# Patient Record
Sex: Male | Born: 1985 | Race: White | Hispanic: No | Marital: Married | Smoking: Former smoker
Health system: Southern US, Community
[De-identification: ages and names within clinical notes are randomized; demographics above are authoritative.]

## PROBLEM LIST (undated history)

## (undated) DIAGNOSIS — K219 Gastro-esophageal reflux disease without esophagitis: Secondary | ICD-10-CM

## (undated) DIAGNOSIS — X58XXXA Exposure to other specified factors, initial encounter: Secondary | ICD-10-CM

## (undated) HISTORY — DX: Exposure to other specified factors, initial encounter: X58.XXXA

---

## 2006-10-01 HISTORY — PX: HERNIA REPAIR: SHX51

## 2006-12-10 ENCOUNTER — Ambulatory Visit: Payer: Self-pay | Admitting: Surgery

## 2006-12-18 ENCOUNTER — Ambulatory Visit: Payer: Self-pay | Admitting: Surgery

## 2007-10-02 HISTORY — PX: WISDOM TOOTH EXTRACTION: SHX21

## 2008-10-01 DIAGNOSIS — X58XXXA Exposure to other specified factors, initial encounter: Secondary | ICD-10-CM

## 2008-10-01 HISTORY — DX: Exposure to other specified factors, initial encounter: X58.XXXA

## 2009-10-01 HISTORY — PX: LEG SURGERY: SHX1003

## 2015-12-21 ENCOUNTER — Ambulatory Visit: Payer: Self-pay | Admitting: Family Medicine

## 2015-12-21 ENCOUNTER — Encounter: Payer: Self-pay | Admitting: Family Medicine

## 2016-10-05 ENCOUNTER — Encounter: Payer: Self-pay | Admitting: Family Medicine

## 2016-10-05 ENCOUNTER — Ambulatory Visit (INDEPENDENT_AMBULATORY_CARE_PROVIDER_SITE_OTHER): Payer: BLUE CROSS/BLUE SHIELD | Admitting: Family Medicine

## 2016-10-05 VITALS — BP 124/70 | HR 96 | Temp 98.0°F | Resp 16 | Ht 70.0 in | Wt 283.0 lb

## 2016-10-05 DIAGNOSIS — Z6841 Body Mass Index (BMI) 40.0 and over, adult: Secondary | ICD-10-CM

## 2016-10-05 DIAGNOSIS — Z131 Encounter for screening for diabetes mellitus: Secondary | ICD-10-CM

## 2016-10-05 DIAGNOSIS — Z0000 Encounter for general adult medical examination without abnormal findings: Secondary | ICD-10-CM | POA: Diagnosis not present

## 2016-10-05 DIAGNOSIS — Z720 Tobacco use: Secondary | ICD-10-CM

## 2016-10-05 DIAGNOSIS — Z6833 Body mass index (BMI) 33.0-33.9, adult: Secondary | ICD-10-CM | POA: Insufficient documentation

## 2016-10-05 DIAGNOSIS — G8929 Other chronic pain: Secondary | ICD-10-CM

## 2016-10-05 DIAGNOSIS — E782 Mixed hyperlipidemia: Secondary | ICD-10-CM

## 2016-10-05 DIAGNOSIS — M79604 Pain in right leg: Secondary | ICD-10-CM | POA: Diagnosis not present

## 2016-10-05 DIAGNOSIS — L0591 Pilonidal cyst without abscess: Secondary | ICD-10-CM | POA: Insufficient documentation

## 2016-10-05 DIAGNOSIS — E785 Hyperlipidemia, unspecified: Secondary | ICD-10-CM | POA: Insufficient documentation

## 2016-10-05 LAB — LIPID PANEL
CHOLESTEROL: 207 mg/dL — AB (ref <200)
HDL: 30 mg/dL — ABNORMAL LOW (ref >40)
LDL CALC: 141 mg/dL — AB (ref <100)
TRIGLYCERIDES: 179 mg/dL — AB (ref <150)
Total CHOL/HDL Ratio: 6.9 Ratio — ABNORMAL HIGH (ref <5.0)
VLDL: 36 mg/dL — AB (ref <30)

## 2016-10-05 LAB — COMPLETE METABOLIC PANEL WITH GFR
ALBUMIN: 4.4 g/dL (ref 3.6–5.1)
ALK PHOS: 41 U/L (ref 40–115)
ALT: 27 U/L (ref 9–46)
AST: 19 U/L (ref 10–40)
BUN: 12 mg/dL (ref 7–25)
CO2: 25 mmol/L (ref 20–31)
Calcium: 9.6 mg/dL (ref 8.6–10.3)
Chloride: 106 mmol/L (ref 98–110)
Creat: 0.73 mg/dL (ref 0.60–1.35)
GFR, Est African American: >89 mL/min (ref >=60)
GLUCOSE: 83 mg/dL (ref 65–99)
POTASSIUM: 4.4 mmol/L (ref 3.5–5.3)
SODIUM: 139 mmol/L (ref 135–146)
Total Bilirubin: 0.5 mg/dL (ref 0.2–1.2)
Total Protein: 7 g/dL (ref 6.1–8.1)

## 2016-10-05 LAB — HEMOGLOBIN A1C
Hgb A1c MFr Bld: 5.1 % (ref <5.7)
MEAN PLASMA GLUCOSE: 100 mg/dL

## 2016-10-05 NOTE — Patient Instructions (Signed)
Thank you for coming in to clinic today.  1.   Referral ordered, they will contact you with appointment. Let us know sooner if worsening pain swelling redness, fevers/chills, we can start you on an antibiotic  [facility] [address] [place]  [id number] Phone: [phone]  Donnalee CurryJeffrey Byrnett, MD  Lab results early next week through MyChart, let me know if questions.  Keep up the good work with future plans to start back at gym, exercise, and healthy diet. Try to limit carbs/portion size, reduce soda, and increase water.  Try to quit smoking, and let me know if need help with this.  Please schedule a follow-up appointment with Dr. Althea CharonKaramalegos in 12 months for Annual Physical, otherwise can return in 6 months for BP and Weight Check  If you have any other questions or concerns, please feel free to call the clinic or send a message through MyChart. You may also schedule an earlier appointment if necessary.  Saralyn PilarAlexander Karamalegos, DO [facility]
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
## 2016-10-05 NOTE — Assessment & Plan Note (Signed)
History of HLD, without recent lab values >1 yr, no prior medication or statin therapy. - Check fasting lipids today (did have creamer in coffee)
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
## 2016-10-05 NOTE — Assessment & Plan Note (Signed)
Stable chronic problem, appears most consistent with superior gluteal cleft pilonidal cyst with some chronic induration, intermittent bloody drainage without recent purulence. No extending erythema or other obvious complication.  Plan: 1. [facility] for further evaluation and anticipate surgical removal. Hold antibiotics at this time given no evidence of acute infection. Return criteria given

## 2016-10-05 NOTE — Assessment & Plan Note (Signed)
Persistent weight gain over 1-2 years, +30-40 lbs, inc stress and no longer regular exercise/gym - Encouraged lifestyle changes with diet/exercise, screening A1c, lipids, chemistry

## 2016-10-05 NOTE — Assessment & Plan Note (Addendum)
Active smoker, now intermittent only, trying to quit on own. Encouraged smoking cessation, reviewed treatment options, plan for continued taper method, also wife trying to quit will help  Discussion today >5 minutes (<10 minutes) specifically on counseling on risks of tobacco use, complications, treatment, smoking cessation.

## 2016-10-10 ENCOUNTER — Encounter: Payer: Self-pay | Admitting: *Deleted

## 2016-10-17 ENCOUNTER — Ambulatory Visit: Payer: Self-pay | Admitting: General Surgery

## 2016-10-25 ENCOUNTER — Ambulatory Visit (INDEPENDENT_AMBULATORY_CARE_PROVIDER_SITE_OTHER): Payer: BLUE CROSS/BLUE SHIELD | Admitting: General Surgery

## 2016-10-25 ENCOUNTER — Encounter: Payer: Self-pay | Admitting: General Surgery

## 2016-10-25 VITALS — BP 130/78 | HR 82 | Resp 12 | Ht 71.0 in | Wt 281.0 lb

## 2016-10-25 DIAGNOSIS — L0501 Pilonidal cyst with abscess: Secondary | ICD-10-CM

## 2016-10-25 MED ORDER — DOXYCYCLINE HYCLATE 100 MG PO CAPS: 100.0000 mg | ORAL_CAPSULE | Freq: Two times a day (BID) | ORAL | 0 refills | Status: DC

## 2016-10-25 NOTE — Patient Instructions (Signed)
Follow-up in 2 weeks. Begin Doxycycline 100 mg twice a day x 2 weeks.
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
## 2016-10-30 LAB — ANAEROBIC AND AEROBIC CULTURE

## 2016-11-15 ENCOUNTER — Encounter: Payer: Self-pay | Admitting: *Deleted

## 2016-11-15 ENCOUNTER — Ambulatory Visit (INDEPENDENT_AMBULATORY_CARE_PROVIDER_SITE_OTHER): Payer: BLUE CROSS/BLUE SHIELD | Admitting: General Surgery

## 2016-11-15 ENCOUNTER — Encounter: Payer: Self-pay | Admitting: General Surgery

## 2016-11-15 VITALS — BP 144/82 | HR 100 | Resp 16 | Ht 71.0 in | Wt 281.0 lb

## 2016-11-15 DIAGNOSIS — L0501 Pilonidal cyst with abscess: Secondary | ICD-10-CM | POA: Diagnosis not present
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
## 2016-11-15 NOTE — Patient Instructions (Addendum)
The patient is aware to call back for any questions or concerns. Pilonidal Cyst Introduction A pilonidal cyst is a fluid-filled sac. It forms beneath the skin near your tailbone, at the top of the crease of your buttocks. A pilonidal cyst that is not large or infected may not cause symptoms or problems. If the cyst becomes irritated or infected, it may fill with pus. This causes pain and swelling (pilonidal abscess). An infected cyst may need to be treated with medicine, drained, or removed. What are the causes? The cause of a pilonidal cyst is not known. One cause may be a hair that grows into your skin (ingrown hair). What increases the risk? Pilonidal cysts are more common in boys and men. Risk factors include:  Having lots of hair near the crease of the buttocks.  Being overweight.  Having a pilonidal dimple.  Wearing tight clothing.  Not bathing or showering frequently.  Sitting for long periods of time. What are the signs or symptoms? Signs and symptoms of a pilonidal cyst may include:  Redness.  Pain and tenderness.  Warmth.  Swelling.  Pus.  Fever. How is this diagnosed? Your health care provider may diagnose a pilonidal cyst based on your symptoms and a physical exam. The health care provider may do a blood test to check for infection. If your cyst is draining pus, your health care provider may take a sample of the drainage to be tested at a laboratory. How is this treated? Surgery is the usual treatment for an infected pilonidal cyst. You may also have to take medicines before surgery. The type of surgery you have depends on the size and severity of the infected cyst. The different kinds of surgery include:  Incision and drainage. This is a procedure to open and drain the cyst.  Marsupialization. In this procedure, a large cyst or abscess may be opened and kept open by stitching the edges of the skin to the cyst walls.  Cyst removal. This procedure involves  opening the skin and removing all or part of the cyst. Follow these instructions at home:  Follow all of your surgeon's instructions carefully if you had surgery.  Take medicines only as directed by your health care provider.  If you were prescribed an antibiotic medicine, finish it all even if you start to feel better.  Keep the area around your pilonidal cyst clean and dry.  Clean the area as directed by your health care provider. Pat the area dry with a clean towel. Do not rub it as this may cause bleeding.  Remove hair from the area around the cyst as directed by your health care provider.  Do not wear tight clothing or sit in one place for long periods of time.  There are many different ways to close and cover an incision, including stitches, skin glue, and adhesive strips. Follow your health care provider's instructions on:  Incision care.  Bandage (dressing) changes and removal.  Incision closure removal. Contact a health care provider if:  You have drainage, redness, swelling, or pain at the site of the cyst.  You have a fever. This information is not intended to replace advice given to you by your health care provider. Make sure you discuss any questions you have with your health care provider. Document Released: 09/14/2000 Document Revised: 02/23/2016 Document Reviewed: 02/04/2014  2017 Elsevier   

## 2016-11-15 NOTE — Progress Notes (Signed)
Patient wishes to check his work schedule and call the office back to arrange a date for pilonidal cyst excision.

## 2016-11-15 NOTE — Progress Notes (Signed)
Patient[facility]. History and physical will be updated the morning of procedure.

## 2016-12-14 ENCOUNTER — Other Ambulatory Visit: Payer: Self-pay | Admitting: General Surgery

## 2016-12-14 DIAGNOSIS — L0591 Pilonidal cyst without abscess: Secondary | ICD-10-CM

## 2016-12-18 ENCOUNTER — Encounter
Admission: RE | Admit: 2016-12-18 | Discharge: 2016-12-18 | Disposition: A | Discharge status: Home / Self Care | Payer: BLUE CROSS/BLUE SHIELD | Source: Ambulatory Visit | Attending: General Surgery | Admitting: General Surgery

## 2016-12-18 HISTORY — DX: Gastro-esophageal reflux disease without esophagitis: K21.9

## 2016-12-18 NOTE — Patient Instructions (Signed)
  Your procedure is scheduled on: 12-26-16 Report to Same Day Surgery 2nd floor medical mall [facility](Medical Mall Entrance-take elevator on left to 2nd floor.  Check in with surgery information desk.) To find out your arrival time please call [phone] between 1PM - 3PM on 12-25-16  Remember: Instructions that are not followed completely may result in serious medical risk, up to and including death, or upon the discretion of your surgeon and anesthesiologist your surgery may need to be rescheduled.    _x___ 1. Do not eat food or drink liquids after midnight. No gum chewing or  hard candies.     __x__ 2. No Alcohol for 24 hours before or after surgery.   __x__3. No Smoking for 24 prior to surgery.   ____  4. Bring all medications with you on the day of surgery if instructed.    __x__ 5. Notify your doctor if there is any change in your medical condition     (cold, fever, infections).     Do not wear jewelry, make-up, hairpins, clips or nail polish.  Do not wear lotions, powders, or perfumes. You may wear deodorant.  Do not shave 48 hours prior to surgery. Men may shave face and neck.  Do not bring valuables to the hospital.    [facility] any belongings or valuables.               Contacts, dentures or bridgework may not be worn into surgery.  Leave your suitcase in the car. After surgery it may be brought to your room.  For patients admitted to the hospital, discharge time is determined by your                       treatment team.   Patients discharged the day of surgery will not be allowed to drive home.  You will need someone to drive you home and stay with you the night of your procedure.    Please read over the following fact sheets that you were given:     ____ Take anti-hypertensive (unless it includes a diuretic), cardiac, seizure, asthma,     anti-reflux and psychiatric medicines. These include:  1. NONE  2.  3.  4.  5.  6.  ____Fleets enema or  Magnesium Citrate as directed.   ____ Use CHG Soap or sage wipes as directed on instruction sheet   ____ Use inhalers on the day of surgery and bring to hospital day of surgery  ____ Stop Metformin and Janumet 2 days prior to surgery.    ____ Take 1/2 of usual insulin dose the night before surgery and none on the morning     surgery.   ____ Follow recommendations from Cardiologist, Pulmonologist or PCP regardingstopping Aspirin, Coumadin, Pllavix ,Eliquis, Effient, or Pradaxa, and Pletal.  X____Stop Anti-inflammatories such as Advil, Aleve, Ibuprofen, Motrin, Naproxen, Naprosyn, Goodies powders or aspirin products NOW-OK to take Tylenol    ____ Stop supplements until after surgery.     ____ Bring C-Pap to the hospital.

## 2016-12-26 ENCOUNTER — Encounter: Payer: Self-pay | Admitting: *Deleted

## 2016-12-26 ENCOUNTER — Ambulatory Visit: Payer: BLUE CROSS/BLUE SHIELD | Admitting: Certified Registered"

## 2016-12-26 ENCOUNTER — Ambulatory Visit
Admission: RE | Admit: 2016-12-26 | Discharge: 2016-12-26 | Disposition: A | Discharge status: Home / Self Care | Payer: BLUE CROSS/BLUE SHIELD | Source: Ambulatory Visit | Attending: General Surgery | Admitting: General Surgery

## 2016-12-26 ENCOUNTER — Encounter
Admission: RE | Disposition: A | Discharge status: Home / Self Care | Payer: Self-pay | Source: Ambulatory Visit | Attending: General Surgery

## 2016-12-26 DIAGNOSIS — L0501 Pilonidal cyst with abscess: Secondary | ICD-10-CM | POA: Diagnosis not present

## 2016-12-26 DIAGNOSIS — Z803 Family history of malignant neoplasm of breast: Secondary | ICD-10-CM | POA: Insufficient documentation

## 2016-12-26 DIAGNOSIS — K219 Gastro-esophageal reflux disease without esophagitis: Secondary | ICD-10-CM | POA: Insufficient documentation

## 2016-12-26 DIAGNOSIS — F17210 Nicotine dependence, cigarettes, uncomplicated: Secondary | ICD-10-CM | POA: Insufficient documentation

## 2016-12-26 DIAGNOSIS — Z79899 Other long term (current) drug therapy: Secondary | ICD-10-CM | POA: Diagnosis not present

## 2016-12-26 DIAGNOSIS — Z8249 Family history of ischemic heart disease and other diseases of the circulatory system: Secondary | ICD-10-CM | POA: Insufficient documentation

## 2016-12-26 DIAGNOSIS — L0591 Pilonidal cyst without abscess: Secondary | ICD-10-CM

## 2016-12-26 HISTORY — PX: PILONIDAL CYST EXCISION: SHX744

## 2016-12-26 SURGERY — EXCISION, SIMPLE PILONIDAL CYST
Anesthesia: General

## 2016-12-26 MED ORDER — ONDANSETRON HCL 4 MG/2ML IJ SOLN
INTRAMUSCULAR | Status: DC | PRN
  Administered 2016-12-26: 4 mg via INTRAVENOUS

## 2016-12-26 MED ORDER — FAMOTIDINE 20 MG PO TABS
20.0000 mg | ORAL_TABLET | Freq: Once | ORAL | Status: AC
  Administered 2016-12-26: 20 mg via ORAL

## 2016-12-26 MED ORDER — DEXAMETHASONE SODIUM PHOSPHATE 10 MG/ML IJ SOLN
INTRAMUSCULAR | Status: AC
  Filled 2016-12-26: qty 1

## 2016-12-26 MED ORDER — ROCURONIUM BROMIDE 100 MG/10ML IV SOLN
INTRAVENOUS | Status: DC | PRN
  Administered 2016-12-26: 30 mg via INTRAVENOUS
  Administered 2016-12-26: 20 mg via INTRAVENOUS

## 2016-12-26 MED ORDER — BUPIVACAINE HCL (PF) 0.5 % IJ SOLN
INTRAMUSCULAR | Status: AC
  Filled 2016-12-26: qty 30

## 2016-12-26 MED ORDER — SUCCINYLCHOLINE CHLORIDE 20 MG/ML IJ SOLN
INTRAMUSCULAR | Status: AC
  Filled 2016-12-26: qty 1

## 2016-12-26 MED ORDER — FENTANYL CITRATE (PF) 250 MCG/5ML IJ SOLN
INTRAMUSCULAR | Status: AC
  Filled 2016-12-26: qty 5

## 2016-12-26 MED ORDER — FAMOTIDINE 20 MG PO TABS
ORAL_TABLET | ORAL | Status: AC
  Administered 2016-12-26: 20 mg via ORAL
  Filled 2016-12-26: qty 1

## 2016-12-26 MED ORDER — MIDAZOLAM HCL 2 MG/2ML IJ SOLN
INTRAMUSCULAR | Status: DC | PRN
  Administered 2016-12-26: 2 mg via INTRAVENOUS

## 2016-12-26 MED ORDER — MIDAZOLAM HCL 2 MG/2ML IJ SOLN
INTRAMUSCULAR | Status: AC
  Filled 2016-12-26: qty 2

## 2016-12-26 MED ORDER — LACTATED RINGERS IV SOLN
INTRAVENOUS | Status: DC
  Administered 2016-12-26: 10:00:00 via INTRAVENOUS

## 2016-12-26 MED ORDER — SUGAMMADEX SODIUM 200 MG/2ML IV SOLN
INTRAVENOUS | Status: DC | PRN
  Administered 2016-12-26: 250 mg via INTRAVENOUS

## 2016-12-26 MED ORDER — ROCURONIUM BROMIDE 50 MG/5ML IV SOLN
INTRAVENOUS | Status: AC
  Filled 2016-12-26: qty 1

## 2016-12-26 MED ORDER — CEFAZOLIN SODIUM-DEXTROSE 2-4 GM/100ML-% IV SOLN
INTRAVENOUS | Status: AC
  Administered 2016-12-26: 2 g via INTRAVENOUS
  Filled 2016-12-26: qty 100

## 2016-12-26 MED ORDER — PROPOFOL 10 MG/ML IV BOLUS
INTRAVENOUS | Status: DC | PRN
  Administered 2016-12-26: 200 mg via INTRAVENOUS
  Administered 2016-12-26: 80 mg via INTRAVENOUS

## 2016-12-26 MED ORDER — LIDOCAINE HCL (CARDIAC) 20 MG/ML IV SOLN
INTRAVENOUS | Status: DC | PRN
  Administered 2016-12-26: 50 mg via INTRAVENOUS

## 2016-12-26 MED ORDER — ONDANSETRON HCL 4 MG/2ML IJ SOLN: 4.0000 mg | Freq: Once | INTRAMUSCULAR | Status: DC | PRN

## 2016-12-26 MED ORDER — CHLORHEXIDINE GLUCONATE CLOTH 2 % EX PADS: 6.0000 | MEDICATED_PAD | Freq: Once | CUTANEOUS | Status: DC

## 2016-12-26 MED ORDER — SUGAMMADEX SODIUM 500 MG/5ML IV SOLN
INTRAVENOUS | Status: AC
  Filled 2016-12-26: qty 5

## 2016-12-26 MED ORDER — OXYCODONE-ACETAMINOPHEN 5-325 MG PO TABS: 1.0000 | ORAL_TABLET | ORAL | 0 refills | Status: DC | PRN

## 2016-12-26 MED ORDER — BUPIVACAINE HCL (PF) 0.5 % IJ SOLN
INTRAMUSCULAR | Status: DC | PRN
  Administered 2016-12-26: 30 mL

## 2016-12-26 MED ORDER — FENTANYL CITRATE (PF) 100 MCG/2ML IJ SOLN
INTRAMUSCULAR | Status: AC
  Administered 2016-12-26: 25 ug via INTRAVENOUS
  Filled 2016-12-26: qty 2

## 2016-12-26 MED ORDER — SUCCINYLCHOLINE CHLORIDE 20 MG/ML IJ SOLN
INTRAMUSCULAR | Status: DC | PRN
Start: 2016-12-26 — End: 2016-12-26
  Administered 2016-12-26: 120 mg via INTRAVENOUS

## 2016-12-26 MED ORDER — CEFAZOLIN SODIUM-DEXTROSE 2-4 GM/100ML-% IV SOLN
2.0000 g | INTRAVENOUS | Status: AC
  Administered 2016-12-26: 2 g via INTRAVENOUS

## 2016-12-26 MED ORDER — OXYCODONE-ACETAMINOPHEN 5-325 MG PO TABS
1.0000 | ORAL_TABLET | Freq: Once | ORAL | Status: AC
  Administered 2016-12-26: 1 via ORAL

## 2016-12-26 MED ORDER — FENTANYL CITRATE (PF) 100 MCG/2ML IJ SOLN
INTRAMUSCULAR | Status: DC | PRN
  Administered 2016-12-26: 50 ug via INTRAVENOUS
  Administered 2016-12-26: 100 ug via INTRAVENOUS
  Administered 2016-12-26 (×2): 50 ug via INTRAVENOUS

## 2016-12-26 MED ORDER — OXYCODONE-ACETAMINOPHEN 5-325 MG PO TABS
ORAL_TABLET | ORAL | Status: AC
  Administered 2016-12-26: 1 via ORAL
  Filled 2016-12-26: qty 1

## 2016-12-26 MED ORDER — FENTANYL CITRATE (PF) 100 MCG/2ML IJ SOLN
25.0000 ug | INTRAMUSCULAR | Status: DC | PRN
  Administered 2016-12-26 (×4): 25 ug via INTRAVENOUS

## 2016-12-26 MED ORDER — LIDOCAINE HCL (PF) 2 % IJ SOLN
INTRAMUSCULAR | Status: AC
  Filled 2016-12-26: qty 2

## 2016-12-26 MED ORDER — DEXAMETHASONE SODIUM PHOSPHATE 10 MG/ML IJ SOLN
INTRAMUSCULAR | Status: DC | PRN
  Administered 2016-12-26: 10 mg via INTRAVENOUS

## 2016-12-26 MED ORDER — ONDANSETRON HCL 4 MG/2ML IJ SOLN
INTRAMUSCULAR | Status: AC
  Filled 2016-12-26: qty 2

## 2016-12-26 SURGICAL SUPPLY — 25 items
CANISTER SUCT 1200ML W/VALVE (MISCELLANEOUS) ×3 IMPLANT
CLOSURE WOUND 1/2 X4 (GAUZE/BANDAGES/DRESSINGS) ×1
DERMABOND ADVANCED (GAUZE/BANDAGES/DRESSINGS) ×2
DERMABOND ADVANCED .7 DNX12 (GAUZE/BANDAGES/DRESSINGS) ×1 IMPLANT
DRAPE LAPAROTOMY 100X77 ABD (DRAPES) ×3 IMPLANT
DRSG OPSITE POSTOP 4X6 (GAUZE/BANDAGES/DRESSINGS) ×3 IMPLANT
DRSG TEGADERM 4X4.75 (GAUZE/BANDAGES/DRESSINGS) ×3 IMPLANT
DRSG TELFA 4X3 1S NADH ST (GAUZE/BANDAGES/DRESSINGS) ×3 IMPLANT
ELECT REM PT RETURN 9FT ADLT (ELECTROSURGICAL) ×3
ELECTRODE REM PT RTRN 9FT ADLT (ELECTROSURGICAL) ×1 IMPLANT
GLOVE BIO SURGEON STRL SZ7 (GLOVE) ×3 IMPLANT
GOWN STRL REUS W/ TWL LRG LVL3 (GOWN DISPOSABLE) ×2 IMPLANT
GOWN STRL REUS W/TWL LRG LVL3 (GOWN DISPOSABLE) ×4
KIT RM TURNOVER STRD PROC AR (KITS) ×3 IMPLANT
LABEL OR SOLS (LABEL) ×3 IMPLANT
NEEDLE HYPO 25X1 1.5 SAFETY (NEEDLE) ×3 IMPLANT
NS IRRIG 500ML POUR BTL (IV SOLUTION) ×3 IMPLANT
PACK BASIN MINOR ARMC (MISCELLANEOUS) ×3 IMPLANT
SOL PREP PVP 2OZ (MISCELLANEOUS) ×3
SOLUTION PREP PVP 2OZ (MISCELLANEOUS) ×1 IMPLANT
STRIP CLOSURE SKIN 1/2X4 (GAUZE/BANDAGES/DRESSINGS) ×2 IMPLANT
SUT MNCRL AB 3-0 PS2 27 (SUTURE) ×9 IMPLANT
SUT VIC AB 2-0 CT1 27 (SUTURE) ×4
SUT VIC AB 2-0 CT1 TAPERPNT 27 (SUTURE) ×2 IMPLANT
SYR CONTROL 10ML (SYRINGE) ×3 IMPLANT

## 2016-12-26 NOTE — Anesthesia Preprocedure Evaluation (Signed)
Anesthesia Evaluation  Patient identified by MRN, date of birth, ID band Patient awake    Reviewed: Allergy & Precautions, H&P , NPO status , Patient's Chart, lab work & pertinent test results, reviewed documented beta blocker date and time   Airway Mallampati: II  TM Distance: >3 FB Neck ROM: full    Dental  (+) Teeth Intact   Pulmonary neg pulmonary ROS, Current Smoker,    Pulmonary exam normal        Cardiovascular negative cardio ROS Normal cardiovascular exam Rhythm:regular Rate:Normal     Neuro/Psych negative neurological ROS  negative psych ROS   GI/Hepatic negative GI ROS, Neg liver ROS, GERD  Medicated,  Endo/Other  negative endocrine ROS  Renal/GU negative Renal ROS  negative genitourinary   Musculoskeletal   Abdominal   Peds  Hematology negative hematology ROS (+)   Anesthesia Other Findings Past Medical History: 2010: Accident     Comment: tractor-WAS IN ICU FOR 3 WEEKS No date: GERD (gastroesophageal reflux disease) Past Surgical History: 2008: HERNIA REPAIR 2011: LEG SURGERY     Comment: right side 2009: WISDOM TOOTH EXTRACTION BMI    Body Mass Index:  39.03 kg/m     Reproductive/Obstetrics negative OB ROS                             Anesthesia Physical Anesthesia Plan  ASA: II  Anesthesia Plan: General ETT   Post-op Pain Management:    Induction:   Airway Management Planned:   Additional Equipment:   Intra-op Plan:   Post-operative Plan:   Informed Consent: I have reviewed the patients History and Physical, chart, labs and discussed the procedure including the risks, benefits and alternatives for the proposed anesthesia with the patient or authorized representative who has indicated his/her understanding and acceptance.   Dental Advisory Given  Plan Discussed with: CRNA  Anesthesia Plan Comments:         Anesthesia Quick Evaluation

## 2016-12-26 NOTE — Op Note (Signed)
Preop diagnosis: Pilonidal cyst with recurring abscesses  Post op diagnosis: Same  Operation: Excision pilonidal cyst  Surgeon: Kathreen CosierS. G. Sankar  Assistant:     Anesthesia: Gen.  Complications: None  EBL: Minimal  Drains: None  Description: Patient was put to sleep on the stretcher with an endotracheal tube and subsequently positioned prone with attention paid to all areas of concern. The upper gluteal cleft area was then prepped and draped as sterile field and timeout performed. The healed pilonidal cyst was about a 2 cm long with no additional openings noted in the cleft in the upper part. Elliptical excision was mapped out. 0.5% Marcaine was instilled totaling 30 minute mL all around for postop analgesia. Skin incision was then made and this was then carried down perpendicular to the fascia underlying which was also removed along with the specimen. Bleeding was controlled cautery. The cyst along with the subcutaneous tissue and the underlying fascia was excised out. After ensuring hemostasis the fascial tissue was closed with interrupted 2-0 Vicryl. The subcutaneous tissue also closed with 2-0 Vicryl. Skin was then approximated with subcuticular 3-0 Monocryl stitch. Dermabond was applied followed by a honeycomb dressing. Patient tolerated the procedure while he was subsequently returned to supine position on the stretcher and then extubated. Patient subsequently returned recovery room stable condition.

## 2016-12-26 NOTE — Discharge Instructions (Signed)

## 2016-12-26 NOTE — Anesthesia Post-op Follow-up Note (Cosign Needed)
Anesthesia QCDR form completed.        

## 2016-12-26 NOTE — Anesthesia Procedure Notes (Signed)
Procedure Name: Intubation Performed by: Mathews ArgyleLOGAN, Avacyn Kloosterman Pre-anesthesia Checklist: Patient identified, Patient being monitored, Timeout performed, Emergency Drugs available and Suction available Patient Re-evaluated:Patient Re-evaluated prior to inductionOxygen Delivery Method: Circle system utilized Preoxygenation: Pre-oxygenation with 100% oxygen Intubation Type: IV induction and Rapid sequence Ventilation: Oral airway inserted - appropriate to patient size and Two handed mask ventilation required Laryngoscope Size: McGraph and 4 Grade View: Grade I Tube type: Oral Tube size: 7.5 mm Number of attempts: 1 Airway Equipment and Method: Stylet Placement Confirmation: ETT inserted through vocal cords under direct vision,  positive ETCO2 and breath sounds checked- equal and bilateral Secured at: 23 cm Tube secured with: Tape Dental Injury: Teeth and Oropharynx as per pre-operative assessment  Future Recommendations: Recommend- induction with short-acting agent, and alternative techniques readily available

## 2016-12-26 NOTE — Interval H&P Note (Signed)
History and Physical Interval Note:  12/26/2016 10:02 AM  Kelly Gentry  has presented today for surgery, with the diagnosis of PILONIDAL CYST  The various methods of treatment have been discussed with the patient and family. After consideration of risks, benefits and other options for treatment, the patient has consented to  Procedure(s): CYST EXCISION PILONIDAL SIMPLE (N/A) as a surgical intervention .  The patient's history has been reviewed, patient examined, no change in status, stable for surgery.  I have reviewed the patient's chart and labs.  Questions were answered to the patient's satisfaction.     SANKAR,SEEPLAPUTHUR G

## 2016-12-26 NOTE — H&P (Signed)
Kelly Gentry is an 31 y.o. male.   Chief Complaint:pilonidal cyst HPI: 31 yr old male with recurring episodes  Of infection/abscess in pilonidal cyst. Currently with no symptoms but has had at least 3 flare ups in last 3 mos.  Past Medical History:  Diagnosis Date  . 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 GERD (gastroesophageal reflux disease)     Past Surgical History:  Procedure Laterality Date  . HERNIA REPAIR  2008  . LEG SURGERY  2011   right side  . WISDOM TOOTH EXTRACTION  2009    Family History  Problem Relation Age of Onset  . Cancer Mother     breast  . Hypertension Father   . Diabetes Neg Hx   . Heart disease Neg Hx    Social History:  reports that he has been smoking Cigarettes.  He has a 5.00 pack-year smoking history. He has quit using smokeless tobacco. He reports that he does not drink alcohol or use drugs.  Allergies: No Known Allergies  Medications Prior to Admission  Medication Sig Dispense Refill  . calcium carbonate (TUMS - DOSED IN MG ELEMENTAL CALCIUM) 500 MG chewable tablet Chew 1 tablet by mouth as needed for indigestion or heartburn.      No results found for this or any previous visit (from the past 48 hour(s)). No results found.  Review of Systems  Constitutional: Negative.   Cardiovascular: Negative.   Gastrointestinal: Negative.   Genitourinary: Negative.     Blood pressure 129/67, pulse 92, temperature 98.1 F (36.7 C), temperature source Oral, resp. rate 18, height 5\' 10"  (1.778 m), weight 272 lb (123.4 kg), SpO2 100 %. Physical Exam  Constitutional: He is oriented to person, place, and time. He appears well-developed and well-nourished.  Eyes: Conjunctivae are normal. No scleral icterus.  Neck: Neck supple.  Cardiovascular: Normal rate, regular rhythm and normal heart sounds.   Respiratory: Effort normal and breath sounds normal.  GI: Soft. Bowel sounds are normal. There is no tenderness.  Lymphadenopathy:    He  has no cervical adenopathy.  Neurological: He is alert and oriented to person, place, and time.  Skin: Skin is warm and dry.        Assessment/Plan Pilonidal cyst with recurring abscess. Proceed with planned excision of pilonidal cyst.  Kieth BrightlySANKAR,SEEPLAPUTHUR G, MD 12/26/2016, 9:59 AM

## 2016-12-26 NOTE — Transfer of Care (Signed)
Immediate Anesthesia Transfer of Care Note  Patient: Kelly Gentry  Procedure(s) Performed: Procedure(s): CYST EXCISION PILONIDAL SIMPLE (N/A)  Patient Location: PACU  Anesthesia Type:General  Level of Consciousness: awake  Airway & Oxygen Therapy: Patient Spontanous Breathing and Patient connected to face mask oxygen  Post-op Assessment: Report given to RN and Post -op Vital signs reviewed and stable  Post vital signs: Reviewed  Last Vitals:  Vitals:   12/26/16 0856 12/26/16 1118  BP: 129/67 (!) 158/76  Pulse: 92 (!) 118  Resp: 18 18  Temp: 36.7 C 36.2 C    Last Pain:  Vitals:   12/26/16 0856  TempSrc: Oral  PainSc: 0-No pain         Complications: No apparent anesthesia complications

## 2016-12-27 ENCOUNTER — Encounter: Payer: Self-pay | Admitting: General Surgery

## 2016-12-27 LAB — SURGICAL PATHOLOGY

## 2016-12-27 NOTE — Anesthesia Postprocedure Evaluation (Signed)
Anesthesia Post Note  Patient: Aleatha Borerravis J Caputi  Procedure(s) Performed: Procedure(s) (LRB): CYST EXCISION PILONIDAL SIMPLE (N/A)  Patient location during evaluation: PACU Anesthesia Type: General Level of consciousness: awake and alert Pain management: pain level controlled Vital Signs Assessment: post-procedure vital signs reviewed and stable Respiratory status: spontaneous breathing, nonlabored ventilation, respiratory function stable and patient connected to nasal cannula oxygen Cardiovascular status: blood pressure returned to baseline and stable Postop Assessment: no signs of nausea or vomiting Anesthetic complications: no     Last Vitals:  Vitals:   12/26/16 1209 12/26/16 1306  BP: (!) 142/73 137/73  Pulse: 86 86  Resp: 16 18  Temp: 36.7 C     Last Pain:  Vitals:   12/26/16 1306  TempSrc:   PainSc: 3                  Yevette EdwardsJames G Santiago Stenzel

## 2017-01-02 ENCOUNTER — Encounter: Payer: Self-pay | Admitting: General Surgery

## 2017-01-02 ENCOUNTER — Ambulatory Visit (INDEPENDENT_AMBULATORY_CARE_PROVIDER_SITE_OTHER): Payer: BLUE CROSS/BLUE SHIELD | Admitting: General Surgery

## 2017-01-02 VITALS — BP 122/80 | HR 82 | Resp 12 | Ht 71.0 in | Wt 275.0 lb

## 2017-01-02 DIAGNOSIS — L0501 Pilonidal cyst with abscess: Secondary | ICD-10-CM

## 2017-01-02 NOTE — Progress Notes (Signed)
Patient ID: Kelly Gentry, male   DOB: 10/14/1985, 30 y.o.   MRN: [id number]  Chief Complaint  Patient presents with  . Routine Post Op    pilondial cyst exision     HPI Kelly Gentry is a 31 y.o. male here today for his post op pilondial cyst exision  Done on 12/26/2016. Patient states he is doing well, no pain or bleeding.  HPI  Past Medical History:  Diagnosis Date  . 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 GERD (gastroesophageal reflux disease)     Past Surgical History:  Procedure Laterality Date  . HERNIA REPAIR  2008  . LEG SURGERY  2011   right side  . PILONIDAL CYST EXCISION N/A 12/26/2016   Procedure: CYST EXCISION PILONIDAL SIMPLE;  Surgeon: Kieth Brightly, MD;  Location: [facility] ORS;  Service: General;  Laterality: N/A;  . WISDOM TOOTH EXTRACTION  2009    Family History  Problem Relation Age of Onset  . Cancer Mother     breast  . Hypertension Father   . Diabetes Neg Hx   . Heart disease Neg Hx     Social History Social History  Substance Use Topics  . Smoking status: Current Some Day Smoker    Packs/day: 0.50    Years: 10.00    Types: Cigarettes  . Smokeless tobacco: Former Neurosurgeon     Comment: 3-4 days/week-  Vapes also  . Alcohol use No    No Known Allergies  Current Outpatient Prescriptions  Medication Sig Dispense Refill  . calcium carbonate (TUMS - DOSED IN MG ELEMENTAL CALCIUM) 500 MG chewable tablet Chew 1 tablet by mouth as needed for indigestion or heartburn.     No current facility-administered medications for this visit.     Review of Systems Review of Systems  Constitutional: Negative.   Respiratory: Negative.   Cardiovascular: Negative.     Blood pressure 122/80, pulse 82, resp. rate 12, height  (1.803 m), weight 275 lb (124.7 kg).  Physical Exam Physical Exam  Constitutional: He is oriented to person, place, and time. He appears well-developed and well-nourished.  Neurological: He is alert and  oriented to person, place, and time.  Skin: Skin is warm and dry.       Data Reviewed Notes reviewed  Path report consistent with a pilonidal process Assessment    One week post excision pilonidal cyst. Doing well    Plan       Patient to return in three weeks.   This information has been scribed by Ples Specter CMA.   I have completed the exam and reviewed the above documentation for accuracy and completeness.  I agree with the above.  Museum/gallery conservator has been used and any errors in dictation or transcription are unintentional.  Ahmoni Edge G. Evette Cristal, M.D., F.A.C.S.  Gerlene Burdock G 01/07/2017, 2:32 PM

## 2017-01-02 NOTE — Patient Instructions (Addendum)
Return in three weeks.. 

## 2017-01-16 ENCOUNTER — Encounter: Payer: Self-pay | Admitting: General Surgery

## 2017-01-16 ENCOUNTER — Ambulatory Visit (INDEPENDENT_AMBULATORY_CARE_PROVIDER_SITE_OTHER): Payer: BLUE CROSS/BLUE SHIELD | Admitting: General Surgery

## 2017-01-16 VITALS — BP 124/80 | HR 110 | Resp 13 | Ht 70.0 in | Wt 273.0 lb

## 2017-01-16 DIAGNOSIS — L0591 Pilonidal cyst without abscess: Secondary | ICD-10-CM

## 2017-01-16 NOTE — Progress Notes (Signed)
Patient ID: Kelly Gentry, male   DOB: 09-25-1986, 31 y.o.   MRN: [id number]  Chief Complaint  Patient presents with  . Follow-up    HPI Kelly Gentry is a 31 y.o. male.  Here today for follow up pilonidal cyst excision on 12-26-16. He states he is doing well. Denies pain. No drainage.  HPI  Past Medical History:  Diagnosis Date  . 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 GERD (gastroesophageal reflux disease)     Past Surgical History:  Procedure Laterality Date  . HERNIA REPAIR  2008  . LEG SURGERY  2011   right side  . PILONIDAL CYST EXCISION N/A 12/26/2016   Procedure: CYST EXCISION PILONIDAL SIMPLE;  Surgeon: Kieth Brightly, MD;  Location: [facility] ORS;  Service: General;  Laterality: N/A;  . WISDOM TOOTH EXTRACTION  2009    Family History  Problem Relation Age of Onset  . Cancer Mother     breast  . Hypertension Father   . Diabetes Neg Hx   . Heart disease Neg Hx     Social History Social History  Substance Use Topics  . Smoking status: Current Some Day Smoker    Packs/day: 0.50    Years: 10.00    Types: Cigarettes  . Smokeless tobacco: Former Neurosurgeon     Comment: 3-4 days/week-  Vapes also  . Alcohol use No    No Known Allergies  Current Outpatient Prescriptions  Medication Sig Dispense Refill  . Sodium Bicarbonate-Citric Acid (ALKA-SELTZER HEARTBURN PO) Take by mouth as needed.     No current facility-administered medications for this visit.     Review of Systems Review of Systems  Constitutional: Negative.   Respiratory: Negative.   Cardiovascular: Negative.     Blood pressure 124/80, pulse (!) 110, resp. rate 13, height  (1.778 m), weight 273 lb (123.8 kg).  Physical Exam Physical Exam  Constitutional: He is oriented to person, place, and time. He appears well-developed and well-nourished.  Neurological: He is alert and oriented to person, place, and time.  Skin: Skin is warm and dry.     Small open area 1 cm at  lower end of incision, superficial  Psychiatric: His behavior is normal.    Data Reviewed Prior notes reviewed  Assessment    3 week post excision pilonidal cyst. Doing well.     Plan       Keep area clean, call for drainage. Follow up in 1 month.  HPI, Physical Exam, Assessment and Plan have been scribed under the direction and in the presence of Kathreen Cosier, MD  Dorathy Daft, RN  I have completed the exam and reviewed the above documentation for accuracy and completeness.  I agree with the above.  Museum/gallery conservator has been used and any errors in dictation or transcription are unintentional.  Lakira Ogando G. Evette Cristal, M.D., F.A.C.S.  Gerlene Burdock G 01/16/2017, 10:30 AM

## 2017-01-16 NOTE — Patient Instructions (Addendum)
The patient is aware to call back for any questions or concerns. Follow up in 1 month. Call if there is drainage

## 2017-02-19 ENCOUNTER — Ambulatory Visit: Payer: BLUE CROSS/BLUE SHIELD | Admitting: General Surgery

## 2017-06-30 DIAGNOSIS — Z5321 Procedure and treatment not carried out due to patient leaving prior to being seen by health care provider: Secondary | ICD-10-CM | POA: Diagnosis not present

## 2017-06-30 DIAGNOSIS — S6992XA Unspecified injury of left wrist, hand and finger(s), initial encounter: Secondary | ICD-10-CM | POA: Diagnosis not present

## 2017-11-17 ENCOUNTER — Emergency Department
Admission: EM | Admit: 2017-11-17 | Discharge: 2017-11-18 | Disposition: A | Discharge status: Home / Self Care | Payer: BLUE CROSS/BLUE SHIELD | Attending: Student in an Organized Health Care Education/Training Program | Admitting: Student in an Organized Health Care Education/Training Program

## 2017-11-17 ENCOUNTER — Emergency Department: Payer: BLUE CROSS/BLUE SHIELD

## 2017-11-17 ENCOUNTER — Other Ambulatory Visit: Payer: Self-pay

## 2017-11-17 ENCOUNTER — Encounter: Payer: Self-pay | Admitting: Emergency Medicine

## 2017-11-17 DIAGNOSIS — R197 Diarrhea, unspecified: Secondary | ICD-10-CM | POA: Diagnosis not present

## 2017-11-17 DIAGNOSIS — R112 Nausea with vomiting, unspecified: Secondary | ICD-10-CM | POA: Insufficient documentation

## 2017-11-17 DIAGNOSIS — R1013 Epigastric pain: Secondary | ICD-10-CM | POA: Insufficient documentation

## 2017-11-17 DIAGNOSIS — F17210 Nicotine dependence, cigarettes, uncomplicated: Secondary | ICD-10-CM | POA: Diagnosis not present

## 2017-11-17 LAB — CBC WITH DIFFERENTIAL/PLATELET
Basophils Absolute: 0.1 10*3/uL (ref 0–0.1)
Basophils Relative: 0 %
EOS ABS: 0 10*3/uL (ref 0–0.7)
Eosinophils Relative: 0 %
HCT: 46.1 % (ref 40.0–52.0)
HEMOGLOBIN: 15.4 g/dL (ref 13.0–18.0)
LYMPHS ABS: 0.5 10*3/uL — AB (ref 1.0–3.6)
LYMPHS PCT: 2 %
MCH: 27.9 pg (ref 26.0–34.0)
MCHC: 33.5 g/dL (ref 32.0–36.0)
MCV: 83.2 fL (ref 80.0–100.0)
MONOS PCT: 3 %
Monocytes Absolute: 0.6 10*3/uL (ref 0.2–1.0)
NEUTROS PCT: 95 %
Neutro Abs: 19.9 10*3/uL — ABNORMAL HIGH (ref 1.4–6.5)
Platelets: 252 10*3/uL (ref 150–440)
RBC: 5.54 MIL/uL (ref 4.40–5.90)
RDW: 13.5 % (ref 11.5–14.5)
WBC: 21.1 10*3/uL — ABNORMAL HIGH (ref 3.8–10.6)

## 2017-11-17 LAB — COMPREHENSIVE METABOLIC PANEL
ALK PHOS: 54 U/L (ref 38–126)
ALT: 26 U/L (ref 17–63)
ANION GAP: 11 (ref 5–15)
AST: 30 U/L (ref 15–41)
Albumin: 4.7 g/dL (ref 3.5–5.0)
BILIRUBIN TOTAL: 0.8 mg/dL (ref 0.3–1.2)
BUN: 18 mg/dL (ref 6–20)
CALCIUM: 9.1 mg/dL (ref 8.9–10.3)
CO2: 19 mmol/L — ABNORMAL LOW (ref 22–32)
CREATININE: 0.85 mg/dL (ref 0.61–1.24)
Chloride: 109 mmol/L (ref 101–111)
Glucose, Bld: 105 mg/dL — ABNORMAL HIGH (ref 65–99)
Potassium: 3.9 mmol/L (ref 3.5–5.1)
SODIUM: 139 mmol/L (ref 135–145)
TOTAL PROTEIN: 7.6 g/dL (ref 6.5–8.1)

## 2017-11-17 LAB — LIPASE, BLOOD: LIPASE: 37 U/L (ref 11–51)

## 2017-11-17 LAB — INFLUENZA PANEL BY PCR (TYPE A & B)
INFLAPCR: NEGATIVE
INFLBPCR: NEGATIVE

## 2017-11-17 MED ORDER — PROMETHAZINE HCL 25 MG/ML IJ SOLN
25.0000 mg | Freq: Four times a day (QID) | INTRAMUSCULAR | Status: DC | PRN
  Administered 2017-11-17: 25 mg via INTRAVENOUS
  Filled 2017-11-17: qty 1

## 2017-11-17 MED ORDER — SODIUM CHLORIDE 0.9 % IV BOLUS (SEPSIS)
1000.0000 mL | Freq: Once | INTRAVENOUS | Status: AC
  Administered 2017-11-17: 1000 mL via INTRAVENOUS

## 2017-11-17 MED ORDER — PROMETHAZINE HCL 12.5 MG PO TABS: 12.5000 mg | ORAL_TABLET | Freq: Four times a day (QID) | ORAL | 0 refills | Status: DC | PRN

## 2017-11-17 NOTE — Discharge Instructions (Signed)

## 2017-11-17 NOTE — ED Triage Notes (Signed)
Pt started vomiting/diarrhea around 4pm. Everyone in home also sick. Given zofran and 500cc bolus ns

## 2017-11-17 NOTE — ED Provider Notes (Signed)
[facility] Emergency Department Provider Note    First MD Initiated Contact with Patient 11/17/17 2213     (approximate)  I have reviewed the triage vital signs and the nursing notes.   HISTORY  Chief Complaint Emesis and Diarrhea    HPI Kelly Gentry is a 32 y.o. male with a history of reflux but no recent hospitalizations or recent surgeries presents with sudden onset nausea vomiting diarrhea around 4 PM.  Is not had anything this afternoon to eat.  States that family members at home are sick with similar illness.  States he vomited multiple times and started vomiting bile and then thinks that he pulled something as he is having epigastric pain and having trouble finding a safe comfortable place to sit.  States his legs are also cramping.  Denies any shortness of breath or chest pain.  Did not vomit any blood.  There is no forceful retching.  Denies any alcohol use today.  Past Medical History:  Diagnosis Date  . 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 GERD (gastroesophageal reflux disease)    Family History  Problem Relation Age of Onset  . Cancer Mother        breast  . Hypertension Father   . Diabetes Neg Hx   . Heart disease Neg Hx    Past Surgical History:  Procedure Laterality Date  . HERNIA REPAIR  2008  . LEG SURGERY  2011   right side  . PILONIDAL CYST EXCISION N/A 12/26/2016   Procedure: CYST EXCISION PILONIDAL SIMPLE;  Surgeon: Kieth Brightly, MD;  Location: [facility] ORS;  Service: General;  Laterality: N/A;  . WISDOM TOOTH EXTRACTION  2009   Patient Active Problem List   Diagnosis Date Noted  . Morbid obesity with BMI of 40.0-44.9, adult (HCC) 10/05/2016  . Hyperlipidemia 10/05/2016  . Chronic pain of right lower extremity 10/05/2016  . Tobacco abuse 10/05/2016  . Pilonidal cyst without infection 10/05/2016      Prior to Admission medications   Medication Sig Start Date End Date Taking? Authorizing Provider    promethazine (PHENERGAN) 12.5 MG tablet Take 1 tablet (12.5 mg total) by mouth every 6 (six) hours as needed for nausea or vomiting. 11/17/17   Willy Eddy, MD  Sodium Bicarbonate-Citric Acid (ALKA-SELTZER HEARTBURN PO) Take by mouth as needed.    [provider]    Allergies Patient has no known allergies.    Social History Social History   Tobacco Use  . Smoking status: Current Some Day Smoker    Packs/day: 0.50    Years: 10.00    Pack years: 5.00    Types: Cigarettes  . Smokeless tobacco: Former Neurosurgeon  . Tobacco comment: 3-4 days/week-  Vapes also  Substance Use Topics  . Alcohol use: No  . Drug use: No    Review of Systems Patient denies headaches, rhinorrhea, blurry vision, numbness, shortness of breath, chest pain, edema, cough, abdominal pain, nausea, vomiting, diarrhea, dysuria, fevers, rashes or hallucinations unless otherwise stated above in HPI. ____________________________________________   PHYSICAL EXAM:  VITAL SIGNS: Vitals:   11/17/17 2214  BP: (!) 131/97  Pulse: 100  Resp: 18  Temp: 98.3 F (36.8 C)  SpO2: 100%    Constitutional: Alert and oriented.  in no acute distress. Eyes: Conjunctivae are normal.  Head: Atraumatic. Nose: No congestion/rhinnorhea. Mouth/Throat: Mucous membranes are moist.   Neck: No stridor. Painless ROM.  Cardiovascular: Normal rate, regular rhythm. Grossly  normal heart sounds.  Good peripheral circulation. Respiratory: Normal respiratory effort.  No retractions. Lungs CTAB. Gastrointestinal: Soft and nontender. No distention. No abdominal bruits. No CVA tenderness. Genitourinary:  Musculoskeletal: No lower extremity tenderness nor edema.  No joint effusions. Neurologic:  Normal speech and language. No gross focal neurologic deficits are appreciated. No facial droop Skin:  Skin is warm, dry and intact. No rash noted. Psychiatric: Mood and affect are normal. Speech and behavior are  normal.  ____________________________________________   LABS (all labs ordered are listed, but only abnormal results are displayed)  Results for orders placed or performed during the hospital encounter of 11/17/17 (from the past 24 hour(s))  CBC with Differential/Platelet     Status: Abnormal   Collection Time: 11/17/17 10:16 PM  Result Value Ref Range   WBC 21.1 (H) 3.8 - 10.6 K/uL   RBC 5.54 4.40 - 5.90 MIL/uL   Hemoglobin 15.4 13.0 - 18.0 g/dL   HCT 16.146.1 09.640.0 - 04.552.0 %   MCV 83.2 80.0 - 100.0 fL   MCH 27.9 26.0 - 34.0 pg   MCHC 33.5 32.0 - 36.0 g/dL   RDW 40.913.5 81.111.5 - 91.414.5 %   Platelets 252 150 - 440 K/uL   Neutrophils Relative % 95 %   Neutro Abs 19.9 (H) 1.4 - 6.5 K/uL   Lymphocytes Relative 2 %   Lymphs Abs 0.5 (L) 1.0 - 3.6 K/uL   Monocytes Relative 3 %   Monocytes Absolute 0.6 0.2 - 1.0 K/uL   Eosinophils Relative 0 %   Eosinophils Absolute 0.0 0 - 0.7 K/uL   Basophils Relative 0 %   Basophils Absolute 0.1 0 - 0.1 K/uL  Comprehensive metabolic panel     Status: Abnormal   Collection Time: 11/17/17 10:16 PM  Result Value Ref Range   Sodium 139 135 - 145 mmol/L   Potassium 3.9 3.5 - 5.1 mmol/L   Chloride 109 101 - 111 mmol/L   CO2 19 (L) 22 - 32 mmol/L   Glucose, Bld 105 (H) 65 - 99 mg/dL   BUN 18 6 - 20 mg/dL   Creatinine, Ser 7.820.85 0.61 - 1.24 mg/dL   Calcium 9.1 8.9 - 95.610.3 mg/dL   Total Protein 7.6 6.5 - 8.1 g/dL   Albumin 4.7 3.5 - 5.0 g/dL   AST 30 15 - 41 U/L   ALT 26 17 - 63 U/L   Alkaline Phosphatase 54 38 - 126 U/L   Total Bilirubin 0.8 0.3 - 1.2 mg/dL   GFR calc non Af Amer >60 >60 mL/min   GFR calc Af Amer >60 >60 mL/min   Anion gap 11 5 - 15  Lipase, blood     Status: None   Collection Time: 11/17/17 10:16 PM  Result Value Ref Range   Lipase 37 11 - 51 U/L   ____________________________________________  EKG____________________________________________  RADIOLOGY  I personally reviewed all radiographic images ordered to evaluate for the above  acute complaints and reviewed radiology reports and findings.  These findings were personally discussed with the patient.  Please see medical record for radiology report.  ____________________________________________   PROCEDURES  Procedure(s) performed:  Procedures    Critical Care performed: no ____________________________________________   INITIAL IMPRESSION / ASSESSMENT AND PLAN / ED COURSE  Pertinent labs & imaging results that were available during my care of the patient were reviewed by me and considered in my medical decision making (see chart for details).  DDX: enteritis, gastritis, viral illness, c-diff, flu, pna, boerrhaaves, pneumomediastinum  Kelly Gentry is a 32 y.o. who presents to the ED with nausea vomiting diarrhea that started abruptly at 4 PM with several other house members being sick with similar symptoms.  Denies any abdominal pain.  Has some epigastric discomfort secondary to vomiting.  No chest pain.  Does endorse headache and myalgias.  Will provide IV fluids for dehydration as well as IV antiemetics.  We will check blood work for the above differential.  Order chest x-ray to evaluate for pneumonia.  Have lower suspicion for Boerhaave's or pneumomediastinum based on his benign exam.  Clinical Course as of Nov 17 2302  Wynelle Link Nov 17, 2017  2258 Blood work is reassuring.  Chest x-ray shows no new fluids IV antibiotics.  This not clinically consistent with acute appendicitis cholelithiasis or cholecystitis.  Most consistent with either viral enteritis, flulike illness, influenza neurovirus.  Patient will be signed out to oncoming physician pending completion of IV fluids and tolerating p.o.  Patient will be given prescription for antiemetics.  Anticipate discharge home.  Have discussed with the patient and available family all diagnostics and treatments performed thus far and all questions were answered to the best of my ability. The patient demonstrates  understanding and agreement with plan.   [PR]    Clinical Course User Index [PR] Willy Eddy, MD     ____________________________________________   FINAL CLINICAL IMPRESSION(S) / ED DIAGNOSES  Final diagnoses:  Nausea vomiting and diarrhea      NEW MEDICATIONS STARTED DURING THIS VISIT:  New Prescriptions   PROMETHAZINE (PHENERGAN) 12.5 MG TABLET    Take 1 tablet (12.5 mg total) by mouth every 6 (six) hours as needed for nausea or vomiting.     Note:  This document was prepared using Dragon voice recognition software and may include unintentional dictation errors.    Willy Eddy, MD 11/17/17 [phone]

## 2017-11-18 NOTE — ED Provider Notes (Signed)
-----------------------------------------   1:09 AM on 11/18/2017 -----------------------------------------  Patient tolerated PO intake, finished IV fluids, stable for discharge.  Provided papers written by Dr. Roxan Hockeyobinson.   Loleta RoseForbach, Theophile Harvie, MD 11/18/17 [phone]

## 2017-11-18 NOTE — ED Notes (Signed)
Pt sipping on pedialyte. Pt states he is ready to go home.

## 2018-02-06 ENCOUNTER — Encounter: Payer: BLUE CROSS/BLUE SHIELD | Admitting: Family Medicine

## 2018-04-11 ENCOUNTER — Other Ambulatory Visit: Payer: Self-pay | Admitting: Family Medicine

## 2018-04-11 DIAGNOSIS — Z6841 Body Mass Index (BMI) 40.0 and over, adult: Secondary | ICD-10-CM

## 2018-04-11 DIAGNOSIS — Z0000 Encounter for general adult medical examination without abnormal findings: Secondary | ICD-10-CM

## 2018-04-11 DIAGNOSIS — R7309 Other abnormal glucose: Secondary | ICD-10-CM

## 2018-04-11 DIAGNOSIS — E782 Mixed hyperlipidemia: Secondary | ICD-10-CM

## 2018-04-14 ENCOUNTER — Other Ambulatory Visit: Payer: Self-pay

## 2018-04-21 ENCOUNTER — Encounter: Payer: Self-pay | Admitting: Family Medicine

## 2019-05-16 IMAGING — DX DG CHEST 1V PORT
1 series · 1 of 1 positions shown · non-contrast
Comparison: None.

CLINICAL DATA: Epigastric pain. Nausea and vomiting. Concern for
pneumomediastinum.

EXAM:
PORTABLE CHEST 1 VIEW

[chest ap]
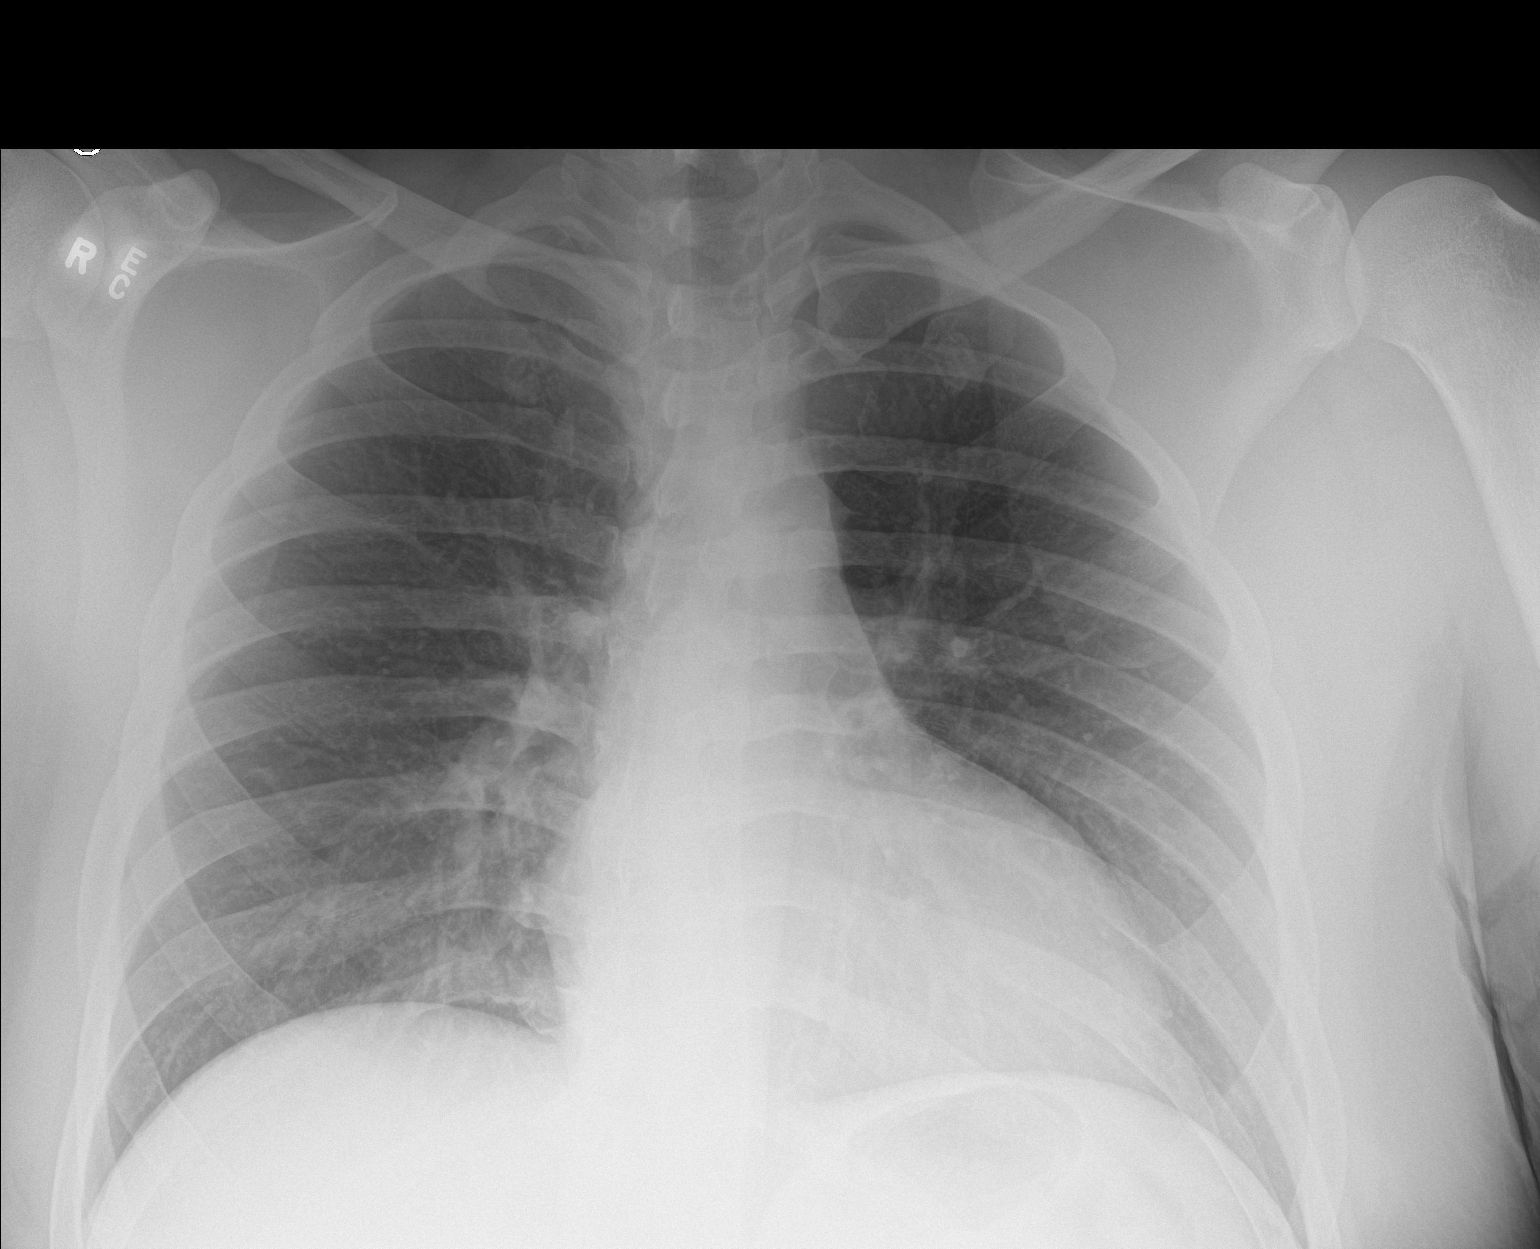

[1 of 1 positions shown; findings below may reference images not displayed]

FINDINGS: No evidence of pneumomediastinum.The cardiomediastinal contours are
normal. The lungs are clear. Pulmonary vasculature is normal. No
consolidation, pleural effusion, or pneumothorax. No acute osseous
abnormalities are seen.
IMPRESSION: Unremarkable radiographs of the chest. Particularly, no evidence of
pneumomediastinum.

## 2019-08-19 ENCOUNTER — Telehealth: Payer: Self-pay | Admitting: Family Medicine

## 2019-08-19 DIAGNOSIS — R7309 Other abnormal glucose: Secondary | ICD-10-CM

## 2019-08-19 DIAGNOSIS — Z0000 Encounter for general adult medical examination without abnormal findings: Secondary | ICD-10-CM

## 2019-08-19 DIAGNOSIS — E782 Mixed hyperlipidemia: Secondary | ICD-10-CM

## 2019-08-19 NOTE — Telephone Encounter (Signed)
Labs ordered  Nobie Putnam, [facility] 08/19/2019, 5:38 PM

## 2019-08-24 ENCOUNTER — Other Ambulatory Visit: Payer: Self-pay

## 2019-08-24 DIAGNOSIS — R7309 Other abnormal glucose: Secondary | ICD-10-CM

## 2019-08-24 DIAGNOSIS — Z0000 Encounter for general adult medical examination without abnormal findings: Secondary | ICD-10-CM

## 2019-08-24 DIAGNOSIS — Z6841 Body Mass Index (BMI) 40.0 and over, adult: Secondary | ICD-10-CM

## 2019-08-24 DIAGNOSIS — E782 Mixed hyperlipidemia: Secondary | ICD-10-CM

## 2019-08-25 LAB — CBC WITH DIFFERENTIAL/PLATELET
Absolute Monocytes: 511 cells/uL (ref 200–950)
Basophils Absolute: 28 cells/uL (ref 0–200)
Basophils Relative: 0.4 %
Eosinophils Absolute: 110 cells/uL (ref 15–500)
Eosinophils Relative: 1.6 %
HCT: 43.4 % (ref 38.5–50.0)
Hemoglobin: 14.5 g/dL (ref 13.2–17.1)
Lymphs Abs: 2705 cells/uL (ref 850–3900)
MCH: 27.9 pg (ref 27.0–33.0)
MCHC: 33.4 g/dL (ref 32.0–36.0)
MCV: 83.5 fL (ref 80.0–100.0)
MPV: 10.9 fL (ref 7.5–12.5)
Monocytes Relative: 7.4 %
Neutro Abs: 3547 cells/uL (ref 1500–7800)
Neutrophils Relative %: 51.4 %
Platelets: 311 10*3/uL (ref 140–400)
RBC: 5.2 10*6/uL (ref 4.20–5.80)
RDW: 12.3 % (ref 11.0–15.0)
Total Lymphocyte: 39.2 %
WBC: 6.9 10*3/uL (ref 3.8–10.8)

## 2019-08-25 LAB — COMPLETE METABOLIC PANEL WITH GFR
AG Ratio: 1.8 (calc) (ref 1.0–2.5)
ALT: 20 U/L (ref 9–46)
AST: 17 U/L (ref 10–40)
Albumin: 4.4 g/dL (ref 3.6–5.1)
Alkaline phosphatase (APISO): 57 U/L (ref 36–130)
BUN: 17 mg/dL (ref 7–25)
CO2: 22 mmol/L (ref 20–32)
Calcium: 9.8 mg/dL (ref 8.6–10.3)
Chloride: 108 mmol/L (ref 98–110)
Creat: 0.68 mg/dL (ref 0.60–1.35)
GFR, Est African American: 145 mL/min/{1.73_m2} (ref >=60)
GFR, Est Non African American: 125 mL/min/{1.73_m2} (ref >=60)
Globulin: 2.4 g/dL (calc) (ref 1.9–3.7)
Glucose, Bld: 89 mg/dL (ref 65–99)
Potassium: 4.8 mmol/L (ref 3.5–5.3)
Sodium: 140 mmol/L (ref 135–146)
Total Bilirubin: 0.3 mg/dL (ref 0.2–1.2)
Total Protein: 6.8 g/dL (ref 6.1–8.1)

## 2019-08-25 LAB — HEMOGLOBIN A1C
Hgb A1c MFr Bld: 5.2 % of total Hgb (ref <5.7)
Mean Plasma Glucose: 103 (calc)
eAG (mmol/L): 5.7 (calc)

## 2019-08-25 LAB — LIPID PANEL
Cholesterol: 198 mg/dL (ref <200)
HDL: 39 mg/dL — ABNORMAL LOW (ref >=40)
LDL Cholesterol (Calc): 139 mg/dL (calc) — ABNORMAL HIGH
Non-HDL Cholesterol (Calc): 159 mg/dL (calc) — ABNORMAL HIGH (ref <130)
Total CHOL/HDL Ratio: 5.1 (calc) — ABNORMAL HIGH (ref <5.0)
Triglycerides: 95 mg/dL (ref <150)

## 2019-08-31 ENCOUNTER — Encounter: Payer: Self-pay | Admitting: Family Medicine

## 2019-08-31 ENCOUNTER — Other Ambulatory Visit: Payer: Self-pay

## 2019-08-31 ENCOUNTER — Ambulatory Visit (INDEPENDENT_AMBULATORY_CARE_PROVIDER_SITE_OTHER): Payer: PRIVATE HEALTH INSURANCE | Admitting: Family Medicine

## 2019-08-31 VITALS — BP 122/70 | HR 89 | Temp 98.3°F | Ht 71.0 in | Wt 250.2 lb

## 2019-08-31 DIAGNOSIS — E782 Mixed hyperlipidemia: Secondary | ICD-10-CM

## 2019-08-31 DIAGNOSIS — Z6834 Body mass index (BMI) 34.0-34.9, adult: Secondary | ICD-10-CM | POA: Diagnosis not present

## 2019-08-31 DIAGNOSIS — Z0000 Encounter for general adult medical examination without abnormal findings: Secondary | ICD-10-CM

## 2019-08-31 NOTE — Assessment & Plan Note (Signed)
Clinically with BMI 34 but would not consider obese due to muscle mass Wt down to 250 lbs from 273 in 2018 Encourage lifestyle improve diet exercise regimen

## 2019-08-31 NOTE — Assessment & Plan Note (Signed)
Mild elevated LDL 139 and slight low HDL 39  Plan: Encourage improved lifestyle - low carb/cholesterol, reduce portion size, continue regular exercise - add more cardio  F/u yearly lipid

## 2019-08-31 NOTE — Progress Notes (Signed)
Subjective:    Patient ID: Kelly Gentry, male    DOB: 06-26-1986, 33 y.o.   MRN: [id number]  Kelly Gentry is a 33 y.o. male presenting on 08/31/2019 for Annual Exam   HPI   Here for Annual Physical and Lab Review  Lifestyle / Wellness - He reports that in past he was able to lose some weight - improved BP in past. - He plans to continue gym regular exercise routine, now through work. Over past 6 months, mostly focusing on strength training, and less cardio. He has history of prior hip injury. He was doing some power lifting lately - Nutrition pattern is a "bulking cycle" - He is very physical with work as well - Labs A1c 5.2, normal range. - Home BP readings avg 120s usually. Today he just left the gym. Disc golf playing regularly.  HYPERLIPIDEMIA: - Reports no concerns. Last lipid panel 08/2019, mild elevated LDL and TG Not on statin or other cholesterol med.  Health Maintenance: Due for Flu Shot, declines today despite counseling on benefits   Depression screen PHQ 2/9 08/31/2019  Decreased Interest 0  Down, Depressed, Hopeless 0  PHQ - 2 Score 0    Past Medical History:  Diagnosis Date  . 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 GERD (gastroesophageal reflux disease)    Past Surgical History:  Procedure Laterality Date  . HERNIA REPAIR  2008  . LEG SURGERY  2011   right side  . PILONIDAL CYST EXCISION N/A 12/26/2016   Procedure: CYST EXCISION PILONIDAL SIMPLE;  Surgeon: Kieth Brightly, MD;  Location: [facility] ORS;  Service: General;  Laterality: N/A;  . WISDOM TOOTH EXTRACTION  2009   Social History   Socioeconomic History  . Marital status: Married    Spouse name: Not on file  . Number of children: Not on file  . Years of education: Not on file  . Highest education level: Not on file  Occupational History  . Not on file  Social Needs  . Financial resource strain: Not on file  . Food insecurity    Worry: Not on file    Inability:  Not on file  . Transportation needs    Medical: Not on file    Non-medical: Not on file  Tobacco Use  . Smoking status: Former Smoker    Packs/day: 0.50    Years: 10.00    Pack years: 5.00    Types: Cigarettes    Quit date: 05/02/2019    Years since quitting: 0.3  . Smokeless tobacco: Current User    Types: Chew  . Tobacco comment: 3-4 days/week-  Vapes also  Substance and Sexual Activity  . Alcohol use: No  . Drug use: No  . Sexual activity: Not on file  Lifestyle  . Physical activity    Days per week: Not on file    Minutes per session: Not on file  . Stress: Not on file  Relationships  . Social Musician on phone: Not on file    Gets together: Not on file    Attends religious service: Not on file    Active member of club or organization: Not on file    Attends meetings of clubs or organizations: Not on file    Relationship status: Not on file  . Intimate partner violence    Fear of current or ex partner: Not on file    Emotionally abused: Not on file  Physically abused: Not on file    Forced sexual activity: Not on file  Other Topics Concern  . Not on file  Social History Narrative  . Not on file   Family History  Problem Relation Age of Onset  . Cancer Mother        breast  . Hypertension Father   . Diabetes Neg Hx   . Heart disease Neg Hx   . Colon cancer Neg Hx   . Prostate cancer Neg Hx    No current outpatient medications on file prior to visit.   No current facility-administered medications on file prior to visit.     Review of Systems  Constitutional: Negative for activity change, appetite change, chills, diaphoresis, fatigue and fever.  HENT: Negative for congestion and hearing loss.   Eyes: Negative for visual disturbance.  Respiratory: Negative for apnea, cough, chest tightness, shortness of breath and wheezing.   Cardiovascular: Negative for chest pain, palpitations and leg swelling.  Gastrointestinal: Negative for abdominal  pain, anal bleeding, blood in stool, constipation, diarrhea, nausea and vomiting.  Endocrine: Negative for cold intolerance.  Genitourinary: Negative for difficulty urinating, dysuria, frequency and hematuria.  Musculoskeletal: Negative for arthralgias, back pain and neck pain.  Skin: Negative for rash.  Allergic/Immunologic: Negative for environmental allergies.  Neurological: Negative for dizziness, weakness, light-headedness, numbness and headaches.  Hematological: Negative for adenopathy.  Psychiatric/Behavioral: Negative for behavioral problems, dysphoric mood and sleep disturbance. The patient is not nervous/anxious.    Per HPI unless specifically indicated above     Objective:    BP 122/70 (BP Location: Left Arm, Cuff Size: Normal)   Pulse 89   Temp 98.3 F (36.8 C) (Oral)   Ht 5\' 11"  (1.803 m)   Wt 250 lb 3.2 oz (113.5 kg)   BMI 34.90 kg/m   Wt Readings from Last 3 Encounters:  08/31/19 250 lb 3.2 oz (113.5 kg)  11/17/17 245 lb (111.1 kg)  01/16/17 273 lb (123.8 kg)    Physical Exam Vitals signs and nursing note reviewed.  Constitutional:      General: He is not in acute distress.    Appearance: He is well-developed. He is not diaphoretic.     Comments: Well-appearing, comfortable, cooperative, muscular build  HENT:     Head: Normocephalic and atraumatic.  Eyes:     General:        Right eye: No discharge.        Left eye: No discharge.     Conjunctiva/sclera: Conjunctivae normal.     Pupils: Pupils are equal, round, and reactive to light.  Neck:     Musculoskeletal: Normal range of motion and neck supple.     Thyroid: No thyromegaly.  Cardiovascular:     Rate and Rhythm: Normal rate and regular rhythm.     Heart sounds: Normal heart sounds. No murmur.  Pulmonary:     Effort: Pulmonary effort is normal. No respiratory distress.     Breath sounds: Normal breath sounds. No wheezing or rales.  Abdominal:     General: Bowel sounds are normal. There is no  distension.     Palpations: Abdomen is soft. There is no mass.     Tenderness: There is no abdominal tenderness.  Musculoskeletal: Normal range of motion.        General: No tenderness.     Comments: Upper / Lower Extremities: - Normal muscle tone, strength bilateral upper extremities 5/5, lower extremities 5/5  Lymphadenopathy:     Cervical: No  cervical adenopathy.  Skin:    General: Skin is warm and dry.     Findings: No erythema or rash.  Neurological:     Mental Status: He is alert and oriented to person, place, and time.     Comments: Distal sensation intact to light touch all extremities  Psychiatric:        Behavior: Behavior normal.     Comments: Well groomed, good eye contact, normal speech and thoughts       Results for orders placed or performed in visit on 08/24/19  physical Lipid panel  Result Value Ref Range   Cholesterol 198 <200 mg/dL   HDL 39 (L) > OR = 40 mg/dL   Triglycerides 95 <409<150 mg/dL   LDL Cholesterol (Calc) 139 (H) mg/dL (calc)   Total CHOL/HDL Ratio 5.1 (H) <5.0 (calc)   Non-HDL Cholesterol (Calc) 159 (H) <130 mg/dL (calc)  physical CBC  Result Value Ref Range   WBC 6.9 3.8 - 10.8 Thousand/uL   RBC 5.20 4.20 - 5.80 Million/uL   Hemoglobin 14.5 13.2 - 17.1 g/dL   HCT 81.143.4 91.438.5 - 78.250.0 %   MCV 83.5 80.0 - 100.0 fL   MCH 27.9 27.0 - 33.0 pg   MCHC 33.4 32.0 - 36.0 g/dL   RDW 95.612.3 21.311.0 - 08.615.0 %   Platelets 311 140 - 400 Thousand/uL   MPV 10.9 7.5 - 12.5 fL   Neutro Abs 3,547 1,500 - 7,800 cells/uL   Lymphs Abs 2,705 850 - 3,900 cells/uL   Absolute Monocytes 511 200 - 950 cells/uL   Eosinophils Absolute 110 15 - 500 cells/uL   Basophils Absolute 28 0 - 200 cells/uL   Neutrophils Relative % 51.4 %   Total Lymphocyte 39.2 %   Monocytes Relative 7.4 %   Eosinophils Relative 1.6 %   Basophils Relative 0.4 %  physical HgB A1c  Result Value Ref Range   Hgb A1c MFr Bld 5.2 <5.7 % of total Hgb   Mean Plasma Glucose 103 (calc)   eAG (mmol/L) 5.7  (calc)  cmet with GFR physical  Result Value Ref Range   Glucose, Bld 89 65 - 99 mg/dL   BUN 17 7 - 25 mg/dL   Creat 5.780.68 4.690.60 - 6.291.35 mg/dL   GFR, Est Non African American 125 > OR = 60 mL/min/1.3473m2   GFR, Est African American 145 > OR = 60 mL/min/1.2973m2   BUN/Creatinine Ratio NOT APPLICABLE 6 - 22 (calc)   Sodium 140 135 - 146 mmol/L   Potassium 4.8 3.5 - 5.3 mmol/L   Chloride 108 98 - 110 mmol/L   CO2 22 20 - 32 mmol/L   Calcium 9.8 8.6 - 10.3 mg/dL   Total Protein 6.8 6.1 - 8.1 g/dL   Albumin 4.4 3.6 - 5.1 g/dL   Globulin 2.4 1.9 - 3.7 g/dL (calc)   AG Ratio 1.8 1.0 - 2.5 (calc)   Total Bilirubin 0.3 0.2 - 1.2 mg/dL   Alkaline phosphatase (APISO) 57 36 - 130 U/L   AST 17 10 - 40 U/L   ALT 20 9 - 46 U/L      Assessment & Plan:   Problem List Items Addressed This Visit    Hyperlipidemia    Mild elevated LDL 139 and slight low HDL 39  Plan: Encourage improved lifestyle - low carb/cholesterol, reduce portion size, continue regular exercise - add more cardio  F/u yearly lipid      BMI 34.0-34.9,adult    Clinically with BMI  34 but would not consider obese due to muscle mass Wt down to 250 lbs from 273 in 2018 Encourage lifestyle improve diet exercise regimen       Other Visit Diagnoses    Annual physical exam    -  Primary       Updated Health Maintenance information Reviewed recent lab results with patient Encouraged improvement to lifestyle with diet and exercise - Goal of weight loss   No orders of the defined types were placed in this encounter.    Follow up plan: Return in about 1 year (around 08/30/2020) for Annual Physical.  Saralyn Pilar, DO [facility] 08/31/2019, 2:57 PM

## 2019-08-31 NOTE — Patient Instructions (Addendum)
Thank you for coming to the office today.  Keep up the great work. Can reduce high cholesterol foods in diet and keep improving.   DUE for FASTING BLOOD WORK (no food or drink after midnight before the lab appointment, only water or coffee without cream/sugar on the morning of)  SCHEDULE "Lab Only" visit in the morning at the clinic for lab draw in 1 YEAR  - Make sure Lab Only appointment is at about 1 week before your next appointment, so that results will be available  For Lab Results, once available within 2-3 days of blood draw, you can can log in to MyChart online to view your results and a brief explanation. Also, we can discuss results at next follow-up visit.    Please schedule a Follow-up Appointment to: Return in about 1 year (around 08/30/2020) for Annual Physical.  If you have any other questions or concerns, please feel free to call the office or send a message through [place]. You may also schedule an earlier appointment if necessary.  Additionally, you may be receiving a survey about your experience at our office within a few days to 1 week by e-mail or mail. We value your feedback.  Nobie Putnam, DO [facility], [facility]   Heart-Healthy Eating Plan Many factors influence your heart (coronary) health, including eating and exercise habits. Coronary risk increases with abnormal blood fat (lipid) levels. Heart-healthy meal planning includes limiting unhealthy fats, increasing healthy fats, and making other diet and lifestyle changes. What is my plan? Your health care provider may recommend that you:  Limit your fat intake to _________% or less of your total calories each day.  Limit your saturated fat intake to _________% or less of your total calories each day.  Limit the amount of cholesterol in your diet to less than _________ mg per day. What are tips for following this plan? Cooking Cook foods using methods other than frying. Baking,  boiling, grilling, and broiling are all good options. Other ways to reduce fat include:  Removing the skin from poultry.  Removing all visible fats from meats.  Steaming vegetables in water or broth. Meal planning   At meals, imagine dividing your plate into fourths: ? Fill one-half of your plate with vegetables and green salads. ? Fill one-fourth of your plate with whole grains. ? Fill one-fourth of your plate with lean protein foods.  Eat 4-5 servings of vegetables per day. One serving equals 1 cup raw or cooked vegetable, or 2 cups raw leafy greens.  Eat 4-5 servings of fruit per day. One serving equals 1 medium whole fruit,  cup dried fruit,  cup fresh, frozen, or canned fruit, or  cup 100% fruit juice.  Eat more foods that contain soluble fiber. Examples include apples, broccoli, carrots, beans, peas, and barley. Aim to get 25-30 g of fiber per day.  Increase your consumption of legumes, nuts, and seeds to 4-5 servings per week. One serving of dried beans or legumes equals  cup cooked, 1 serving of nuts is  cup, and 1 serving of seeds equals 1 tablespoon. Fats  Choose healthy fats more often. Choose monounsaturated and polyunsaturated fats, such as olive and canola oils, flaxseeds, walnuts, almonds, and seeds.  Eat more omega-3 fats. Choose salmon, mackerel, sardines, tuna, flaxseed oil, and ground flaxseeds. Aim to eat fish at least 2 times each week.  Check food labels carefully to identify foods with trans fats or high amounts of saturated fat.  Limit saturated fats.  These are found in animal products, such as meats, butter, and cream. Plant sources of saturated fats include palm oil, palm kernel oil, and coconut oil.  Avoid foods with partially hydrogenated oils in them. These contain trans fats. Examples are stick margarine, some tub margarines, cookies, crackers, and other baked goods.  Avoid fried foods. General information  Eat more home-cooked food and less  restaurant, buffet, and fast food.  Limit or avoid alcohol.  Limit foods that are high in starch and sugar.  Lose weight if you are overweight. Losing just 5-10% of your body weight can help your overall health and prevent diseases such as diabetes and heart disease.  Monitor your salt (sodium) intake, especially if you have high blood pressure. Talk with your health care provider about your sodium intake.  Try to incorporate more vegetarian meals weekly. What foods can I eat? Fruits All fresh, canned (in natural juice), or frozen fruits. Vegetables Fresh or frozen vegetables (raw, steamed, roasted, or grilled). Green salads. Grains Most grains. Choose whole wheat and whole grains most of the time. Rice and pasta, including brown rice and pastas made with whole wheat. Meats and other proteins Lean, well-trimmed beef, veal, pork, and lamb. Chicken and [place] without skin. All fish and shellfish. Wild duck, rabbit, pheasant, and venison. Egg whites or low-cholesterol egg substitutes. Dried beans, peas, lentils, and tofu. Seeds and most nuts. Dairy Low-fat or nonfat cheeses, including ricotta and mozzarella. Skim or 1% milk (liquid, powdered, or evaporated). Buttermilk made with low-fat milk. Nonfat or low-fat yogurt. Fats and oils Non-hydrogenated (trans-free) margarines. Vegetable oils, including soybean, sesame, sunflower, olive, peanut, safflower, corn, canola, and cottonseed. Salad dressings or mayonnaise made with a vegetable oil. Beverages Water (mineral or sparkling). Coffee and tea. Diet carbonated beverages. Sweets and desserts Sherbet, gelatin, and fruit ice. Small amounts of dark chocolate. Limit all sweets and desserts. Seasonings and condiments All seasonings and condiments. The items listed above may not be a complete list of foods and beverages you can eat. Contact a dietitian for more options. What foods are not recommended? Fruits Canned fruit in heavy syrup. Fruit in  cream or butter sauce. Fried fruit. Limit coconut. Vegetables Vegetables cooked in cheese, cream, or butter sauce. Fried vegetables. Grains Breads made with saturated or trans fats, oils, or whole milk. Croissants. Sweet rolls. Donuts. High-fat crackers, such as cheese crackers. Meats and other proteins Fatty meats, such as hot dogs, ribs, sausage, bacon, rib-eye roast or steak. High-fat deli meats, such as salami and bologna. Caviar. Domestic duck and goose. Organ meats, such as liver. Dairy Cream, sour cream, cream cheese, and creamed cottage cheese. Whole milk cheeses. Whole or 2% milk (liquid, evaporated, or condensed). Whole buttermilk. Cream sauce or high-fat cheese sauce. Whole-milk yogurt. Fats and oils Meat fat, or shortening. Cocoa butter, hydrogenated oils, palm oil, coconut oil, palm kernel oil. Solid fats and shortenings, including bacon fat, salt pork, lard, and butter. Nondairy cream substitutes. Salad dressings with cheese or sour cream. Beverages Regular sodas and any drinks with added sugar. Sweets and desserts Frosting. Pudding. Cookies. Cakes. Pies. Milk chocolate or white chocolate. Buttered syrups. Full-fat ice cream or ice cream drinks. The items listed above may not be a complete list of foods and beverages to avoid. Contact a dietitian for more information. Summary  Heart-healthy meal planning includes limiting unhealthy fats, increasing healthy fats, and making other diet and lifestyle changes.  Lose weight if you are overweight. Losing just 5-10% of your body weight can  help your overall health and prevent diseases such as diabetes and heart disease.  Focus on eating a balance of foods, including fruits and vegetables, low-fat or nonfat dairy, lean protein, nuts and legumes, whole grains, and heart-healthy oils and fats. This information is not intended to replace advice given to you by your health care provider. Make sure you discuss any questions you have with your  health care provider. Document Released: 06/26/2008 Document Revised: 10/25/2017 Document Reviewed: 10/25/2017 Elsevier Patient Education  2020 [employer]

## 2020-01-14 ENCOUNTER — Ambulatory Visit: Payer: PRIVATE HEALTH INSURANCE | Admitting: Podiatry

## 2020-01-14 ENCOUNTER — Encounter: Payer: Self-pay | Admitting: Podiatry

## 2020-01-14 ENCOUNTER — Other Ambulatory Visit: Payer: Self-pay

## 2020-01-14 ENCOUNTER — Ambulatory Visit (INDEPENDENT_AMBULATORY_CARE_PROVIDER_SITE_OTHER): Payer: PRIVATE HEALTH INSURANCE

## 2020-01-14 DIAGNOSIS — M79671 Pain in right foot: Secondary | ICD-10-CM | POA: Diagnosis not present

## 2020-01-14 DIAGNOSIS — M722 Plantar fascial fibromatosis: Secondary | ICD-10-CM | POA: Diagnosis not present

## 2020-01-15 ENCOUNTER — Encounter: Payer: Self-pay | Admitting: Podiatry

## 2020-01-15 NOTE — Progress Notes (Signed)
  Subjective:  Patient ID: Kelly Gentry, male    DOB: Dec 17, 1985,  MRN: [id number]  Chief Complaint  Patient presents with  . Foot Pain    Patient presents today for left heel pain x 3-4 months.  He reports constant pains in the fast few weeks, dull aching, shooting pains and burning.  "it feels like Im walking on a bruise"  He has been taking Ibuprofen, using ice, muscle rubs with no relief    34 y.o. male presents with the above complaint.  Correction from above: Patient presents with right heel pain that has been going for 3 to 4 months.  Patient is experiencing Po static dyskinesia type symptoms.  There is dull and achiness and shooting a burning associated with it.  Patient has tried ibuprofen muscle rub has not helped.  Patient has tried mild stretching but has not helped either.  He denies any other acute complaints.  He would like to discuss treatment options.   Review of Systems: Negative except as noted in the HPI. Denies N/V/F/Ch.  Past Medical History:  Diagnosis Date  . 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 GERD (gastroesophageal reflux disease)     Current Outpatient Medications:  .  acetaminophen (TYLENOL) 325 MG tablet, Take 650 mg by mouth every 6 (six) hours as needed., Disp: , Rfl:  .  ibuprofen (ADVIL) 200 MG tablet, Take 200 mg by mouth every 6 (six) hours as needed., Disp: , Rfl:   Social History   Tobacco Use  Smoking Status Former Smoker  . Packs/day: 0.50  . Years: 10.00  . Pack years: 5.00  . Types: Cigarettes  . Quit date: 05/02/2019  . Years since quitting: 0.7  Smokeless Tobacco Current User  . Types: Chew  Tobacco Comment   3-4 days/week-  Vapes also    No Known Allergies Objective:  There were no vitals filed for this visit. There is no height or weight on file to calculate BMI. Constitutional Well developed. Well nourished.  Vascular Dorsalis pedis pulses palpable bilaterally. Posterior tibial pulses palpable  bilaterally. Capillary refill normal to all digits.  No cyanosis or clubbing noted. Pedal hair growth normal.  Neurologic Normal speech. Oriented to person, place, and time. Epicritic sensation to light touch grossly present bilaterally.  Dermatologic Nails well groomed and normal in appearance. No open wounds. No skin lesions.  Orthopedic: Normal joint ROM without pain or crepitus bilaterally. No visible deformities. Tender to palpation at the calcaneal tuber right No pain with calcaneal squeeze right Ankle ROM full range of motion right Silfverskiold Test: negative right.   Radiographs: Taken and reviewed. No acute fractures or dislocations. No evidence of stress fracture.  Plantar heel spur present. Posterior heel spur Absent  Assessment:   1. Plantar fasciitis of right foot   2. Pain of right heel    Plan:  Patient was evaluated and treated and all questions answered.  Plantar Fasciitis, right - XR reviewed as above.  - Educated on icing and stretching. Instructions given.  - Injection delivered to the plantar fascia as below. - DME: Plantar Fascial Brace - Pharmacologic management: Meloxicam/Medrol Dose Pak. Educated on risks/benefits and proper taking of medication.  Procedure: Injection Tendon/Ligament Location: Right plantar fascia at the glabrous junction; medial approach. Skin Prep: alcohol Injectate: 0.5 cc 0.5% marcaine plain, 0.5 cc of 1% Lidocaine, 0.5 cc kenalog 10. Disposition: Patient tolerated procedure well. Injection site dressed with a band-aid.  No follow-ups on file.

## 2020-02-08 ENCOUNTER — Encounter: Payer: Self-pay | Admitting: Family Medicine

## 2020-02-08 ENCOUNTER — Ambulatory Visit (INDEPENDENT_AMBULATORY_CARE_PROVIDER_SITE_OTHER): Payer: PRIVATE HEALTH INSURANCE | Admitting: Family Medicine

## 2020-02-08 ENCOUNTER — Other Ambulatory Visit: Payer: Self-pay

## 2020-02-08 VITALS — BP 136/75 | HR 86 | Temp 97.8°F | Resp 16 | Ht 71.0 in | Wt 244.6 lb

## 2020-02-08 DIAGNOSIS — M5442 Lumbago with sciatica, left side: Secondary | ICD-10-CM

## 2020-02-08 MED ORDER — PREDNISONE 10 MG PO TABS: ORAL_TABLET | ORAL | 0 refills | Status: DC

## 2020-02-08 MED ORDER — BACLOFEN 10 MG PO TABS: 5.0000 mg | ORAL_TABLET | Freq: Three times a day (TID) | ORAL | 1 refills | Status: DC | PRN

## 2020-02-08 NOTE — Patient Instructions (Addendum)
Thank you for coming to the office today.  1. For your Back Pain - I think that this is due to Muscle Spasms or strain. Your Sciatic Nerve can be affected causing some of your radiation and numbness down your legs. 2. - Start Prednisone taper (steroid anti-inflammatory) for nerve irritation with pain in legs. Each pill is 10mg . Take 6 pills (60mg  daily) for 1 day at same time with breakfast, then each day reduce dose by 1 pill, so 5 pills, then 4, then 3, then 2 then 1 (last 6 days). Do not take any Ibuprofen or Aleve while taking the Prednisone.  - Once finished Prednisone, then start with other anti-inflammatory Aleve or Naproxen 250 or 500mg  with food every 12 hours (or 2 times a day) Ibuprofen 600mg  (take 3 of the 200mg  pills) per dose with food every 6 to 8 hours or 3 times a day, take it every day for at least 2 to 4 weeks then only as needed  3. Start Baclofen (Lioresal) 10mg  tablets - cut in half for 5mg  at night for muscle relaxant - may make you sedated or sleepy (be careful driving or working on this) if tolerated you can take every 8 hours, half or whole tab 4. May use Tylenol Extra Str 500mg  tabs - may take 1-2 tablets every 6 hours as needed 5. Recommend to start using heating pad on your lower back 1-2x daily for few weeks  Also try a Wedge Seat Cushion to avoid nerve pinching when sitting prolonged period of time.  This pain may take weeks to months to fully resolve, but hopefully it will respond to the medicine initially. All back injuries (small or serious) are slow to heal since we use our back muscles every day. Be careful with turning, twisting, lifting, sitting / standing for prolonged periods, and avoid re-injury.  If your symptoms significantly worsen with more pain, or new symptoms with weakness in one or both legs, new or different shooting leg pains, numbness in legs or groin, loss of control or retention of urine or bowel movements, please call back for advice and you may  need to go directly to the Emergency Department.   Please schedule a Follow-up Appointment to: Return in about 4 weeks (around 03/07/2020), or if symptoms worsen or fail to improve, for back pain.  If you have any other questions or concerns, please feel free to call the office or send a message through MyChart. You may also schedule an earlier appointment if necessary.  Additionally, you may be receiving a survey about your experience at our office within a few days to 1 week by e-mail or mail. We value your feedback.  , DO [facility], [facility]             Low Back Pain Exercises  See other page with pictures of each exercise.  Start with 1 or 2 of these exercises that you are most comfortable with. Do not do any exercises that cause you significant worsening pain. Some of these may cause some "stretching soreness" but it should go away after you stop the exercise, and get better over time. Gradually increase up to 3-4 exercises as tolerated.  Standing hamstring stretch: Place the heel of your leg on a stool about 15 inches high. Keep your knee straight. Lean forward, bending at the hips until you feel a mild stretch in the back of your thigh. Make sure you do not roll your shoulders and bend  at the waist when doing this or you will stretch your lower back instead. Hold the stretch for 15 to 30 seconds. Repeat 3 times. Repeat the same stretch on your other leg.  Cat and camel: Get down on your hands and knees. Let your stomach sag, allowing your back to curve downward. Hold this position for 5 seconds. Then arch your back and hold for 5 seconds. Do 3 sets of 10.  Quadriped Arm/Leg Raises: Get down on your hands and knees. Tighten your abdominal muscles to stiffen your spine. While keeping your abdominals tight, raise one arm and the opposite leg away from you. Hold this position for 5 seconds. Lower your arm and leg slowly and alternate sides. Do  this 10 times on each side.  Pelvic tilt: Lie on your back with your knees bent and your feet flat on the floor. Tighten your abdominal muscles and push your lower back into the floor. Hold this position for 5 seconds, then relax. Do 3 sets of 10.  Partial curl: Lie on your back with your knees bent and your feet flat on the floor. Tighten your stomach muscles and flatten your back against the floor. Tuck your chin to your chest. With your hands stretched out in front of you, curl your upper body forward until your shoulders clear the floor. Hold this position for 3 seconds. Don't hold your breath. It helps to breathe out as you lift your shoulders up. Relax. Repeat 10 times. Build to 3 sets of 10. To challenge yourself, clasp your hands behind your head and keep your elbows out to the side.  Lower trunk rotation: Lie on your back with your knees bent and your feet flat on the floor. Tighten your abdominal muscles and push your lower back into the floor. Keeping your shoulders down flat, gently rotate your legs to one side, then the other as far as you can. Repeat 10 to 20 times.  Single knee to chest stretch: Lie on your back with your legs straight out in front of you. Bring one knee up to your chest and grasp the back of your thigh. Pull your knee toward your chest, stretching your buttock muscle. Hold this position for 15 to 30 seconds and return to the starting position. Repeat 3 times on each side.  Double knee to chest: Lie on your back with your knees bent and your feet flat on the floor. Tighten your abdominal muscles and push your lower back into the floor. Pull both knees up to your chest. Hold for 5 seconds and repeat 10 to 20 times.

## 2020-02-08 NOTE — Progress Notes (Signed)
Subjective:    Patient ID: Kelly Gentry, male    DOB: 14-Jun-1986, 33 y.o.   MRN: [id number]  ALEKSA Gentry is a 34 y.o. male presenting on 02/08/2020 for Back Pain (onset 5 days getting worst spasm)  Patient presents for a same day appointment.  HPI   LOW BACK PAIN, Acute low back pain - Reports symptoms started about 4 days ago on Thursday, with known inciting injury pulling on pile on bricks and twisted his back and had immediate pain, bilateral low back.   Today seems to be gradually improving but still persistent pain. Now today when he got up felt different pain now with a more sharp stabbing pain above Left hip pain, and still has some persistent deeper dull aching pain. He has episodic spasms in back muscles triggering left leg to have radiating pain to knee or calf and some numbness. Describes dull pain as mild to moderate severity and has more severe pain with sharp pain and spasms. Has had 8-9 times in about 10 hours today so far. Seems to be episodic and random or intermittent. Can trigger if standing, sitting, walking or mildly active he has tried to limit activity in past several days - He attempted to work today. He left during lunch. - Taking Ibuprofen OTC over weekend. He took BC dose this AM. No significant relief - Tried heating pad at night, ice, muscle, and salt bath, and home stretching regimen. - No prior history of back injury or chronic back pain, no x-rays - Admits difficulty sleeping due to pain at times - Denies any fevers/chills, numbness, tingling, weakness, loss of control bladder/bowel incontinence or retention, unintentional wt loss, night sweats  HM: Up to date on 1 of 2 COVID19 vaccines. Will send [place] record when complete.   Depression screen PHQ 2/9 08/31/2019  Decreased Interest 0  Down, Depressed, Hopeless 0  PHQ - 2 Score 0    Social History   Tobacco Use  . Smoking status: Former Smoker    Packs/day: 0.50    Years: 10.00    Pack  years: 5.00    Types: Cigarettes    Quit date: 05/02/2019    Years since quitting: 0.7  . Smokeless tobacco: Current User    Types: Chew  . Tobacco comment: 3-4 days/week-  Vapes also  Substance Use Topics  . Alcohol use: No  . Drug use: No    Review of Systems Per HPI unless specifically indicated above     Objective:    BP 136/75   Pulse 86   Temp 97.8 F (36.6 C) (Temporal)   Resp 16   Ht 5\' 11"  (1.803 m)   Wt 244 lb 9.6 oz (110.9 kg)   BMI 34.11 kg/m   Wt Readings from Last 3 Encounters:  02/08/20 244 lb 9.6 oz (110.9 kg)  08/31/19 250 lb 3.2 oz (113.5 kg)  11/17/17 245 lb (111.1 kg)    Physical Exam Vitals and nursing note reviewed.  Constitutional:      General: He is not in acute distress.    Appearance: He is well-developed. He is not diaphoretic.     Comments: Well-appearing, comfortable, cooperative  HENT:     Head: Normocephalic and atraumatic.  Eyes:     General:        Right eye: No discharge.        Left eye: No discharge.     Conjunctiva/sclera: Conjunctivae normal.  Cardiovascular:     Rate and  Rhythm: Normal rate.  Pulmonary:     Effort: Pulmonary effort is normal.  Musculoskeletal:     Comments: Low Back Inspection: Normal appearance, Large body habitus, no spinal deformity, symmetrical. Palpation: No tenderness over spinous processes. LEFT lower lumbar paraspinal muscles mild-tender and with hypertonicity/spasm. ROM: Full active ROM forward flex / back extension, rotation L/R without discomfort Special Testing: Seated SLR negative for radicular pain bilaterally  Strength: Bilateral hip flex/ext 5/5, knee flex/ext 5/5, ankle dorsiflex/plantarflex 5/5 Neurovascular: intact distal sensation to light touch  Skin:    General: Skin is warm and dry.     Findings: No erythema or rash.  Neurological:     Mental Status: He is alert and oriented to person, place, and time.  Psychiatric:        Behavior: Behavior normal.     Comments: Well groomed,  good eye contact, normal speech and thoughts    Results for orders placed or performed in visit on 08/24/19  physical Lipid panel  Result Value Ref Range   Cholesterol 198 <200 mg/dL   HDL 39 (L) > OR = 40 mg/dL   Triglycerides 95 <737 mg/dL   LDL Cholesterol (Calc) 139 (H) mg/dL (calc)   Total CHOL/HDL Ratio 5.1 (H) <5.0 (calc)   Non-HDL Cholesterol (Calc) 159 (H) <130 mg/dL (calc)  physical CBC  Result Value Ref Range   WBC 6.9 3.8 - 10.8 Thousand/uL   RBC 5.20 4.20 - 5.80 Million/uL   Hemoglobin 14.5 13.2 - 17.1 g/dL   HCT 10.6 26.9 - 48.5 %   MCV 83.5 80.0 - 100.0 fL   MCH 27.9 27.0 - 33.0 pg   MCHC 33.4 32.0 - 36.0 g/dL   RDW 46.2 70.3 - 50.0 %   Platelets 311 140 - 400 Thousand/uL   MPV 10.9 7.5 - 12.5 fL   Neutro Abs 3,547 1,500 - 7,800 cells/uL   Lymphs Abs 2,705 850 - 3,900 cells/uL   Absolute Monocytes 511 200 - 950 cells/uL   Eosinophils Absolute 110 15 - 500 cells/uL   Basophils Absolute 28 0 - 200 cells/uL   Neutrophils Relative % 51.4 %   Total Lymphocyte 39.2 %   Monocytes Relative 7.4 %   Eosinophils Relative 1.6 %   Basophils Relative 0.4 %  physical HgB A1c  Result Value Ref Range   Hgb A1c MFr Bld 5.2 <5.7 % of total Hgb   Mean Plasma Glucose 103 (calc)   eAG (mmol/L) 5.7 (calc)  cmet with GFR physical  Result Value Ref Range   Glucose, Bld 89 65 - 99 mg/dL   BUN 17 7 - 25 mg/dL   Creat 9.38 1.82 - 9.93 mg/dL   GFR, Est Non African American 125 > OR = 60 mL/min/1.8m2   GFR, Est African American 145 > OR = 60 mL/min/1.27m2   BUN/Creatinine Ratio NOT APPLICABLE 6 - 22 (calc)   Sodium 140 135 - 146 mmol/L   Potassium 4.8 3.5 - 5.3 mmol/L   Chloride 108 98 - 110 mmol/L   CO2 22 20 - 32 mmol/L   Calcium 9.8 8.6 - 10.3 mg/dL   Total Protein 6.8 6.1 - 8.1 g/dL   Albumin 4.4 3.6 - 5.1 g/dL   Globulin 2.4 1.9 - 3.7 g/dL (calc)   AG Ratio 1.8 1.0 - 2.5 (calc)   Total Bilirubin 0.3 0.2 - 1.2 mg/dL   Alkaline phosphatase (APISO) 57 36 - 130 U/L   AST  17 10 - 40 U/L   ALT  20 9 - 46 U/L      Assessment & Plan:   Problem List Items Addressed This Visit    None    Visit Diagnoses    Acute left-sided low back pain with left-sided sciatica    -  Primary   Relevant Medications   predniSONE (DELTASONE) 10 MG tablet   baclofen (LIORESAL) 10 MG tablet      Acute L LBP with associated L sciatica. Suspect likely due to muscle spasm/strain, with known injury twisting heavy weight injury 4 days ago No prior chronic back problems or OA DJD - No red flag symptoms. Negative SLR for radiculopathy - Inadequate conservative therapy   Plan: 1. Start Prednisone burst anti inflammatory 60 to 10mg  over 6 day taper - hold oral nsaid, may resume ibuprofen/aleve after 2. Start muscle relaxant with Baclofen 10mg  tabs - take 5-10mg  up to TID PRN, titrate up as tolerated - caution sedation 3. May use Tylenol PRN for breakthrough 4. Encouraged use of heating pad 1-2x daily for now then PRN 5. Follow-up 4-6 weeks if not improved for re-evaluation, consider X-ray imaging, trial of PT, and possibly referral to Orthopedic   Meds ordered this encounter  Medications  . predniSONE (DELTASONE) 10 MG tablet    Sig: Take 6 tabs with breakfast Day 1, 5 tabs Day 2, 4 tabs Day 3, 3 tabs Day 4, 2 tabs Day 5, 1 tab Day 6.    Dispense:  21 tablet    Refill:  0  . baclofen (LIORESAL) 10 MG tablet    Sig: Take 0.5-1 tablets (5-10 mg total) by mouth 3 (three) times daily as needed for muscle spasms.    Dispense:  30 each    Refill:  1      Follow up plan: Return in about 4 weeks (around 03/07/2020), or if symptoms worsen or fail to improve, for back pain.    , DO [facility] 02/08/2020, 2:59 PM

## 2020-02-23 ENCOUNTER — Ambulatory Visit: Payer: PRIVATE HEALTH INSURANCE | Admitting: Podiatry

## 2020-02-23 ENCOUNTER — Other Ambulatory Visit: Payer: Self-pay

## 2020-02-23 DIAGNOSIS — M722 Plantar fascial fibromatosis: Secondary | ICD-10-CM

## 2020-02-23 DIAGNOSIS — M79671 Pain in right foot: Secondary | ICD-10-CM

## 2020-02-23 DIAGNOSIS — Q666 Other congenital valgus deformities of feet: Secondary | ICD-10-CM

## 2020-02-26 ENCOUNTER — Encounter: Payer: Self-pay | Admitting: Podiatry
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
## 2020-04-06 ENCOUNTER — Other Ambulatory Visit: Payer: PRIVATE HEALTH INSURANCE | Admitting: Orthotics

## 2020-09-20 ENCOUNTER — Ambulatory Visit (INDEPENDENT_AMBULATORY_CARE_PROVIDER_SITE_OTHER): Payer: PRIVATE HEALTH INSURANCE | Admitting: Family Medicine

## 2020-09-20 ENCOUNTER — Other Ambulatory Visit: Payer: Self-pay

## 2020-09-20 ENCOUNTER — Encounter: Payer: Self-pay | Admitting: Family Medicine

## 2020-09-20 VITALS — BP 118/70 | HR 94 | Temp 97.7°F | Resp 16 | Ht 71.0 in | Wt 239.0 lb

## 2020-09-20 DIAGNOSIS — Z0000 Encounter for general adult medical examination without abnormal findings: Secondary | ICD-10-CM

## 2020-09-20 DIAGNOSIS — Z6833 Body mass index (BMI) 33.0-33.9, adult: Secondary | ICD-10-CM

## 2020-09-20 DIAGNOSIS — E782 Mixed hyperlipidemia: Secondary | ICD-10-CM | POA: Diagnosis not present

## 2020-09-20 NOTE — Patient Instructions (Addendum)
Thank you for coming to the office today.  Please send [place] a copy / photo or PDF of your COVID vaccine card. And your blood work results.  BP improved on recheck the automatic cuff was higher.  Keep up the good work overall.  Please schedule a Follow-up Appointment to: Return in about 1 year (around 09/20/2021) for 1 year Annual Physical (review copy of blood work from work).  If you have any other questions or concerns, please feel free to call the office or send a message through MyChart. You may also schedule an earlier appointment if necessary.  Additionally, you may be receiving a survey about your experience at our office within a few days to 1 week by e-mail or mail. We value your feedback.  Saralyn Pilar, DO [facility], [place]

## 2020-09-20 NOTE — Progress Notes (Signed)
Subjective:    Patient ID: Kelly Gentry, male    DOB: 11-Feb-1986, 34 y.o.   MRN: [id number]  Kelly Gentry is a 34 y.o. male presenting on 09/20/2020 for Annual Exam   HPI  Here for Annual Physical and Lab Review  Lifestyle / Wellness BMI >33 He is working on [employer] body weight now more lately He has had normal BP in past. Mild elevated today He does some obstacle training course - He plans to continue gym regular exercise routine, more cardio lately and doing some strength training. - He is very physical with work as well - Previous 2020 Labs A1c 5.2, normal range Diet - he focuses on portion control primarily during holidays  HYPERLIPIDEMIA: - Reports no concerns. Last lipid panel 08/2019, mild elevated LDL and TG - he actually had labs done by employer recently and had improved cholesterol - he will try to get copy of this for [place]. Not on statin or other cholesterol med.  Health Maintenance: Due for Flu Shot, declines today despite counseling on benefits  UTD COVID19 vaccine, he will get [place] copy of card.  Depression screen [facility] 2/9 09/20/2020 08/31/2019  Decreased Interest 0 0  Down, Depressed, Hopeless 0 0  PHQ - 2 Score 0 0    Past Medical History:  Diagnosis Date  . 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 GERD (gastroesophageal reflux disease)    Past Surgical History:  Procedure Laterality Date  . HERNIA REPAIR  2008  . LEG SURGERY  2011   right side  . PILONIDAL CYST EXCISION N/A 12/26/2016   Procedure: CYST EXCISION PILONIDAL SIMPLE;  Surgeon: Kieth Brightly, MD;  Location: [facility] ORS;  Service: General;  Laterality: N/A;  . WISDOM TOOTH EXTRACTION  2009   Social History   Socioeconomic History  . Marital status: Married    Spouse name: Not on file  . Number of children: Not on file  . Years of education: Not on file  . Highest education level: Not on file  Occupational History  . Not on file  Tobacco Use  . Smoking status:  Former Smoker    Packs/day: 0.50    Years: 10.00    Pack years: 5.00    Types: Cigarettes    Quit date: 05/02/2019    Years since quitting: 1.3  . Smokeless tobacco: Current User    Types: Chew  . Tobacco comment: 3-4 days/week-  Vapes also  Vaping Use  . Vaping Use: Some days  Substance and Sexual Activity  . Alcohol use: No  . Drug use: No  . Sexual activity: Not on file  Other Topics Concern  . Not on file  Social History Narrative  . Not on file   Social Determinants of Health   Financial Resource Strain: Not on file  Food Insecurity: Not on file  Transportation Needs: Not on file  Physical Activity: Not on file  Stress: Not on file  Social Connections: Not on file  Intimate Partner Violence: Not on file   Family History  Problem Relation Age of Onset  . Cancer Mother        breast  . Hypertension Father   . Diabetes Neg Hx   . Heart disease Neg Hx   . Colon cancer Neg Hx   . Prostate cancer Neg Hx    Current Outpatient Medications on File Prior to Visit  Medication Sig  . acetaminophen (TYLENOL) 325 MG tablet Take  650 mg by mouth every 6 (six) hours as needed. (Patient not taking: Reported on 09/20/2020)  . ibuprofen (ADVIL) 200 MG tablet Take 200 mg by mouth every 6 (six) hours as needed. (Patient not taking: Reported on 09/20/2020)   No current facility-administered medications on file prior to visit.    Review of Systems  Constitutional: Negative for activity change, appetite change, chills, diaphoresis, fatigue and fever.  HENT: Negative for congestion and hearing loss.   Eyes: Negative for visual disturbance.  Respiratory: Negative for apnea, cough, chest tightness, shortness of breath and wheezing.   Cardiovascular: Negative for chest pain, palpitations and leg swelling.  Gastrointestinal: Negative for abdominal pain, constipation, diarrhea, nausea and vomiting.  Endocrine: Negative for cold intolerance.  Genitourinary: Negative for difficulty  urinating, dysuria, frequency and hematuria.  Musculoskeletal: Negative for arthralgias and neck pain.  Skin: Negative for rash.  Allergic/Immunologic: Negative for environmental allergies.  Neurological: Negative for dizziness, weakness, light-headedness, numbness and headaches.  Hematological: Negative for adenopathy.  Psychiatric/Behavioral: Negative for behavioral problems, dysphoric mood and sleep disturbance.   Per HPI unless specifically indicated above      Objective:    BP 118/70   Pulse 94   Temp 97.7 F (36.5 C) (Temporal)   Resp 16   Ht 5\' 11"  (1.803 m)   Wt 239 lb (108.4 kg)   SpO2 100%   BMI 33.33 kg/m   Wt Readings from Last 3 Encounters:  09/20/20 239 lb (108.4 kg)  02/08/20 244 lb 9.6 oz (110.9 kg)  08/31/19 250 lb 3.2 oz (113.5 kg)    Physical Exam Vitals and nursing note reviewed.  Constitutional:      General: He is not in acute distress.    Appearance: He is well-developed and well-nourished. He is not diaphoretic.     Comments: Well-appearing, comfortable, cooperative  HENT:     Head: Normocephalic and atraumatic.     Mouth/Throat:     Mouth: Oropharynx is clear and moist.  Eyes:     General:        Right eye: No discharge.        Left eye: No discharge.     Extraocular Movements: EOM normal.     Conjunctiva/sclera: Conjunctivae normal.     Pupils: Pupils are equal, round, and reactive to light.  Neck:     Thyroid: No thyromegaly.     Vascular: No carotid bruit.  Cardiovascular:     Rate and Rhythm: Normal rate and regular rhythm.     Pulses: Intact distal pulses.     Heart sounds: Normal heart sounds. No murmur heard.   Pulmonary:     Effort: Pulmonary effort is normal. No respiratory distress.     Breath sounds: Normal breath sounds. No wheezing or rales.  Abdominal:     General: Bowel sounds are normal. There is no distension.     Palpations: Abdomen is soft. There is no mass.     Tenderness: There is no abdominal tenderness.   Musculoskeletal:        General: No tenderness or edema. Normal range of motion.     Cervical back: Normal range of motion and neck supple.     Right lower leg: No edema.     Left lower leg: No edema.     Comments: Upper / Lower Extremities: - Normal muscle tone, strength bilateral upper extremities 5/5, lower extremities 5/5  Lymphadenopathy:     Cervical: No cervical adenopathy.  Skin:    General:  Skin is warm and dry.     Findings: No erythema or rash.  Neurological:     Mental Status: He is alert and oriented to person, place, and time.     Comments: Distal sensation intact to light touch all extremities  Psychiatric:        Mood and Affect: Mood and affect normal.        Behavior: Behavior normal.     Comments: Well groomed, good eye contact, normal speech and thoughts       Results for orders placed or performed in visit on 08/24/19  physical Lipid panel  Result Value Ref Range   Cholesterol 198 <200 mg/dL   HDL 39 (L) > OR = 40 mg/dL   Triglycerides 95 <161<150 mg/dL   LDL Cholesterol (Calc) 139 (H) mg/dL (calc)   Total CHOL/HDL Ratio 5.1 (H) <5.0 (calc)   Non-HDL Cholesterol (Calc) 159 (H) <130 mg/dL (calc)  physical CBC  Result Value Ref Range   WBC 6.9 3.8 - 10.8 Thousand/uL   RBC 5.20 4.20 - 5.80 Million/uL   Hemoglobin 14.5 13.2 - 17.1 g/dL   HCT 09.643.4 04.538.5 - 40.950.0 %   MCV 83.5 80.0 - 100.0 fL   MCH 27.9 27.0 - 33.0 pg   MCHC 33.4 32.0 - 36.0 g/dL   RDW 81.112.3 91.411.0 - 78.215.0 %   Platelets 311 140 - 400 Thousand/uL   MPV 10.9 7.5 - 12.5 fL   Neutro Abs 3,547 1,500 - 7,800 cells/uL   Lymphs Abs 2,705 850 - 3,900 cells/uL   Absolute Monocytes 511 200 - 950 cells/uL   Eosinophils Absolute 110 15 - 500 cells/uL   Basophils Absolute 28 0 - 200 cells/uL   Neutrophils Relative % 51.4 %   Total Lymphocyte 39.2 %   Monocytes Relative 7.4 %   Eosinophils Relative 1.6 %   Basophils Relative 0.4 %  physical HgB A1c  Result Value Ref Range   Hgb A1c MFr Bld 5.2 <5.7 % of  total Hgb   Mean Plasma Glucose 103 (calc)   eAG (mmol/L) 5.7 (calc)  cmet with GFR physical  Result Value Ref Range   Glucose, Bld 89 65 - 99 mg/dL   BUN 17 7 - 25 mg/dL   Creat 9.560.68 2.130.60 - 0.861.35 mg/dL   GFR, Est Non African American 125 > OR = 60 mL/min/1.6173m2   GFR, Est African American 145 > OR = 60 mL/min/1.2073m2   BUN/Creatinine Ratio NOT APPLICABLE 6 - 22 (calc)   Sodium 140 135 - 146 mmol/L   Potassium 4.8 3.5 - 5.3 mmol/L   Chloride 108 98 - 110 mmol/L   CO2 22 20 - 32 mmol/L   Calcium 9.8 8.6 - 10.3 mg/dL   Total Protein 6.8 6.1 - 8.1 g/dL   Albumin 4.4 3.6 - 5.1 g/dL   Globulin 2.4 1.9 - 3.7 g/dL (calc)   AG Ratio 1.8 1.0 - 2.5 (calc)   Total Bilirubin 0.3 0.2 - 1.2 mg/dL   Alkaline phosphatase (APISO) 57 36 - 130 U/L   AST 17 10 - 40 U/L   ALT 20 9 - 46 U/L      Assessment & Plan:   Problem List Items Addressed This Visit    Hyperlipidemia   BMI 33.0-33.9,adult    Other Visit Diagnoses    Annual physical exam    -  Primary      Updated Health Maintenance information - request covid vaccine card Encouraged improvement to lifestyle with  diet and exercise - Not indicated for cholesterol medicine at this time. Will review previous biometric lipid results once available. - Goal of weight loss   No orders of the defined types were placed in this encounter.   Follow up plan: Return in about 1 year (around 09/20/2021) for 1 year Annual Physical (review copy of blood work from work).  Saralyn Pilar, DO [facility] 09/20/2020, 4:33 PM

## 2021-09-29 ENCOUNTER — Ambulatory Visit (INDEPENDENT_AMBULATORY_CARE_PROVIDER_SITE_OTHER): Payer: BC Managed Care – PPO | Admitting: Family Medicine

## 2021-09-29 ENCOUNTER — Encounter: Payer: Self-pay | Admitting: Family Medicine

## 2021-09-29 ENCOUNTER — Other Ambulatory Visit: Payer: Self-pay

## 2021-09-29 VITALS — BP 124/80 | HR 88 | Temp 98.1°F | Ht 71.0 in | Wt 258.0 lb

## 2021-09-29 DIAGNOSIS — Z1159 Encounter for screening for other viral diseases: Secondary | ICD-10-CM

## 2021-09-29 DIAGNOSIS — E669 Obesity, unspecified: Secondary | ICD-10-CM

## 2021-09-29 DIAGNOSIS — Z0000 Encounter for general adult medical examination without abnormal findings: Secondary | ICD-10-CM

## 2021-09-29 DIAGNOSIS — E782 Mixed hyperlipidemia: Secondary | ICD-10-CM

## 2021-09-29 DIAGNOSIS — R7309 Other abnormal glucose: Secondary | ICD-10-CM

## 2021-09-29 NOTE — Patient Instructions (Addendum)
Thank you for coming to the office today.  Keep up the great work  BP improved on re-check  Labs today stay tuned for results  Goal to keep cholesterol in check.  DUE for FASTING BLOOD WORK (no food or drink after midnight before the lab appointment, only water or coffee without cream/sugar on the morning of)  AFTER next visit.  Please schedule a Follow-up Appointment to: Return in about 1 year (around 09/29/2022) for 1 Year Annual Physical then fasting lab AFTER.  If you have any other questions or concerns, please feel free to call the office or send a message through MyChart. You may also schedule an earlier appointment if necessary.  Additionally, you may be receiving a survey about your experience at our office within a few days to 1 week by e-mail or mail. We value your feedback.  Saralyn Pilar, DO [facility], [place]

## 2021-09-29 NOTE — Progress Notes (Signed)
Subjective:    Patient ID: Kelly Gentry, male    DOB: 09-Nov-1985, 35 y.o.   MRN: [id number]  Kelly Gentry is a 35 y.o. male presenting on 09/29/2021 for Annual Exam   HPI  Here for Annual Physical and Lab Review   Lifestyle / Wellness BMI >35 He is working on [employer] body weight now more lately He has had normal BP in past. Mild elevated today He does some obstacle training course - He plans to continue gym regular exercise routine, more cardio lately and doing some strength training. - He has gained some muscle mass, but weight increased, attributes this to muscle bulk - He is very physical with work as well   HYPERLIPIDEMIA: - Reports no concerns. Prior lipids 2020, had repeat 2021 but does not have results available. Not on statin or other cholesterol med.   Health Maintenance: Due for Flu Shot, declines today despite counseling on benefits   UTD COVID19 vaccine, does not have copy of card.  Tdap due anytime, now >10 years.  Depression screen [facility] 2/9 09/29/2021 09/20/2020 08/31/2019  Decreased Interest 0 0 0  Down, Depressed, Hopeless 0 0 0  PHQ - 2 Score 0 0 0  Altered sleeping 0 - -  Tired, decreased energy 0 - -  Change in appetite 0 - -  Feeling bad or failure about yourself  0 - -  Trouble concentrating 0 - -  Moving slowly or fidgety/restless 0 - -  Suicidal thoughts 0 - -  PHQ-9 Score 0 - -  Difficult doing work/chores Not difficult at all - -    Past Medical History:  Diagnosis Date   Accident 2010   tractor-WAS IN ICU FOR 3 WEEKS   GERD (gastroesophageal reflux disease)    Past Surgical History:  Procedure Laterality Date   HERNIA REPAIR  2008   LEG SURGERY  2011   right side   PILONIDAL CYST EXCISION N/A 12/26/2016   Procedure: CYST EXCISION PILONIDAL SIMPLE;  Surgeon: Christene Lye, MD;  Location: [facility] ORS;  Service: General;  Laterality: N/A;   WISDOM TOOTH EXTRACTION  2009   Social History   Socioeconomic History   Marital  status: Married    Spouse name: Not on file   Number of children: Not on file   Years of education: Not on file   Highest education level: Not on file  Occupational History   Not on file  Tobacco Use   Smoking status: Former    Packs/day: 0.50    Years: 10.00    Pack years: 5.00    Types: Cigarettes    Quit date: 05/02/2019    Years since quitting: 2.4   Smokeless tobacco: Current    Types: Chew   Tobacco comments:    3-4 days/week-  Vapes also  Vaping Use   Vaping Use: Some days  Substance and Sexual Activity   Alcohol use: No   Drug use: No   Sexual activity: Not on file  Other Topics Concern   Not on file  Social History Narrative   Not on file   Social Determinants of Health   Financial Resource Strain: Not on file  Food Insecurity: Not on file  Transportation Needs: Not on file  Physical Activity: Not on file  Stress: Not on file  Social Connections: Not on file  Intimate Partner Violence: Not on file   Family History  Problem Relation Age of Onset   Cancer Mother  breast   Hypertension Father    Diabetes Neg Hx    Heart disease Neg Hx    Colon cancer Neg Hx    Prostate cancer Neg Hx    Current Outpatient Medications on File Prior to Visit  Medication Sig   acetaminophen (TYLENOL) 325 MG tablet Take 650 mg by mouth every 6 (six) hours as needed.   ibuprofen (ADVIL) 200 MG tablet Take 200 mg by mouth every 6 (six) hours as needed.   No current facility-administered medications on file prior to visit.    Review of Systems  Constitutional:  Negative for activity change, appetite change, chills, diaphoresis, fatigue and fever.  HENT:  Negative for congestion and hearing loss.   Eyes:  Negative for visual disturbance.  Respiratory:  Negative for cough, chest tightness, shortness of breath and wheezing.   Cardiovascular:  Negative for chest pain, palpitations and leg swelling.  Gastrointestinal:  Negative for abdominal pain, constipation, diarrhea,  nausea and vomiting.  Genitourinary:  Negative for dysuria, frequency and hematuria.  Musculoskeletal:  Negative for arthralgias and neck pain.  Skin:  Negative for rash.  Neurological:  Negative for dizziness, weakness, light-headedness, numbness and headaches.  Hematological:  Negative for adenopathy.  Psychiatric/Behavioral:  Negative for behavioral problems, dysphoric mood and sleep disturbance.   Per HPI unless specifically indicated above      Objective:    BP 124/80 (BP Location: Left Arm, Cuff Size: Normal)    Pulse 88    Temp 98.1 F (36.7 C) (Oral)    Ht 5\' 11"  (1.803 m)    Wt 258 lb (117 kg)    SpO2 99%    BMI 35.98 kg/m   Wt Readings from Last 3 Encounters:  09/29/21 258 lb (117 kg)  09/20/20 239 lb (108.4 kg)  02/08/20 244 lb 9.6 oz (110.9 kg)    Physical Exam Vitals and nursing note reviewed.  Constitutional:      General: He is not in acute distress.    Appearance: He is well-developed. He is not diaphoretic.     Comments: Well-appearing, comfortable, cooperative  HENT:     Head: Normocephalic and atraumatic.  Eyes:     General:        Right eye: No discharge.        Left eye: No discharge.     Conjunctiva/sclera: Conjunctivae normal.     Pupils: Pupils are equal, round, and reactive to light.  Neck:     Thyroid: No thyromegaly.     Vascular: No carotid bruit.  Cardiovascular:     Rate and Rhythm: Normal rate and regular rhythm.     Pulses: Normal pulses.     Heart sounds: Normal heart sounds. No murmur heard. Pulmonary:     Effort: Pulmonary effort is normal. No respiratory distress.     Breath sounds: Normal breath sounds. No wheezing or rales.  Abdominal:     General: Bowel sounds are normal. There is no distension.     Palpations: Abdomen is soft. There is no mass.     Tenderness: There is no abdominal tenderness.  Musculoskeletal:        General: No tenderness. Normal range of motion.     Cervical back: Normal range of motion and neck supple.      Right lower leg: No edema.     Left lower leg: No edema.     Comments: Upper / Lower Extremities: - Normal muscle tone, strength bilateral upper extremities 5/5, lower extremities 5/5  Lymphadenopathy:     Cervical: No cervical adenopathy.  Skin:    General: Skin is warm and dry.     Findings: No erythema or rash.  Neurological:     Mental Status: He is alert and oriented to person, place, and time.     Comments: Distal sensation intact to light touch all extremities  Psychiatric:        Mood and Affect: Mood normal.        Behavior: Behavior normal.        Thought Content: Thought content normal.     Comments: Well groomed, good eye contact, normal speech and thoughts   Results for orders placed or performed in visit on 08/24/19  physical Lipid panel  Result Value Ref Range   Cholesterol 198 <200 mg/dL   HDL 39 (L) > OR = 40 mg/dL   Triglycerides 95 <099 mg/dL   LDL Cholesterol (Calc) 139 (H) mg/dL (calc)   Total CHOL/HDL Ratio 5.1 (H) <5.0 (calc)   Non-HDL Cholesterol (Calc) 159 (H) <130 mg/dL (calc)  physical CBC  Result Value Ref Range   WBC 6.9 3.8 - 10.8 Thousand/uL   RBC 5.20 4.20 - 5.80 Million/uL   Hemoglobin 14.5 13.2 - 17.1 g/dL   HCT 83.3 82.5 - 05.3 %   MCV 83.5 80.0 - 100.0 fL   MCH 27.9 27.0 - 33.0 pg   MCHC 33.4 32.0 - 36.0 g/dL   RDW 97.6 73.4 - 19.3 %   Platelets 311 140 - 400 Thousand/uL   MPV 10.9 7.5 - 12.5 fL   Neutro Abs 3,547 1,500 - 7,800 cells/uL   Lymphs Abs 2,705 850 - 3,900 cells/uL   Absolute Monocytes 511 200 - 950 cells/uL   Eosinophils Absolute 110 15 - 500 cells/uL   Basophils Absolute 28 0 - 200 cells/uL   Neutrophils Relative % 51.4 %   Total Lymphocyte 39.2 %   Monocytes Relative 7.4 %   Eosinophils Relative 1.6 %   Basophils Relative 0.4 %  physical HgB A1c  Result Value Ref Range   Hgb A1c MFr Bld 5.2 <5.7 % of total Hgb   Mean Plasma Glucose 103 (calc)   eAG (mmol/L) 5.7 (calc)  cmet with GFR physical  Result Value Ref  Range   Glucose, Bld 89 65 - 99 mg/dL   BUN 17 7 - 25 mg/dL   Creat 7.90 2.40 - 9.73 mg/dL   GFR, Est Non African American 125 > OR = 60 mL/min/1.20m2   GFR, Est African American 145 > OR = 60 mL/min/1.15m2   BUN/Creatinine Ratio NOT APPLICABLE 6 - 22 (calc)   Sodium 140 135 - 146 mmol/L   Potassium 4.8 3.5 - 5.3 mmol/L   Chloride 108 98 - 110 mmol/L   CO2 22 20 - 32 mmol/L   Calcium 9.8 8.6 - 10.3 mg/dL   Total Protein 6.8 6.1 - 8.1 g/dL   Albumin 4.4 3.6 - 5.1 g/dL   Globulin 2.4 1.9 - 3.7 g/dL (calc)   AG Ratio 1.8 1.0 - 2.5 (calc)   Total Bilirubin 0.3 0.2 - 1.2 mg/dL   Alkaline phosphatase (APISO) 57 36 - 130 U/L   AST 17 10 - 40 U/L   ALT 20 9 - 46 U/L      Assessment & Plan:   Problem List Items Addressed This Visit     Hyperlipidemia   Relevant Orders   COMPLETE METABOLIC PANEL WITH GFR   Lipid panel   Other Visit  Diagnoses     Annual physical exam    -  Primary   Relevant Orders   COMPLETE METABOLIC PANEL WITH GFR   Lipid panel   CBC with Differential/Platelet   Hemoglobin A1c   Obesity (BMI 35.0-39.9 without comorbidity)       Relevant Orders   COMPLETE METABOLIC PANEL WITH GFR   Lipid panel   Abnormal glucose       Relevant Orders   Hemoglobin A1c   Need for hepatitis C screening test       Relevant Orders   Hepatitis C antibody       Updated Health Maintenance information Fasting labs drawn today, pending result. Reviewed prior results from 2020, and discussed goal to keep improving lifestyle and working on lower cholesterol, weight management. Encouraged improvement to lifestyle with diet and exercise Goal of weight loss / some gain due to strength training noted.  No orders of the defined types were placed in this encounter.    Follow up plan: Return in about 1 year (around 09/29/2022) for 1 Year Annual Physical then fasting lab AFTER.   Nobie Putnam, [facility] 09/29/2021, 8:55  AM

## 2021-10-02 LAB — HEMOGLOBIN A1C
Hgb A1c MFr Bld: 5.3 % of total Hgb (ref <5.7)
Mean Plasma Glucose: 105 mg/dL
eAG (mmol/L): 5.8 mmol/L

## 2021-10-02 LAB — COMPLETE METABOLIC PANEL WITH GFR
AG Ratio: 1.8 (calc) (ref 1.0–2.5)
ALT: 26 U/L (ref 9–46)
AST: 19 U/L (ref 10–40)
Albumin: 4.4 g/dL (ref 3.6–5.1)
Alkaline phosphatase (APISO): 69 U/L (ref 36–130)
BUN: 14 mg/dL (ref 7–25)
CO2: 24 mmol/L (ref 20–32)
Calcium: 9.9 mg/dL (ref 8.6–10.3)
Chloride: 106 mmol/L (ref 98–110)
Creat: 0.84 mg/dL (ref 0.60–1.26)
Globulin: 2.5 g/dL (calc) (ref 1.9–3.7)
Glucose, Bld: 90 mg/dL (ref 65–139)
Potassium: 4.3 mmol/L (ref 3.5–5.3)
Sodium: 139 mmol/L (ref 135–146)
Total Bilirubin: 0.3 mg/dL (ref 0.2–1.2)
Total Protein: 6.9 g/dL (ref 6.1–8.1)
eGFR: 117 mL/min/{1.73_m2} (ref >=60)

## 2021-10-02 LAB — CBC WITH DIFFERENTIAL/PLATELET
Absolute Monocytes: 469 cells/uL (ref 200–950)
Basophils Absolute: 27 cells/uL (ref 0–200)
Basophils Relative: 0.4 %
Eosinophils Absolute: 88 cells/uL (ref 15–500)
Eosinophils Relative: 1.3 %
HCT: 44.7 % (ref 38.5–50.0)
Hemoglobin: 14.9 g/dL (ref 13.2–17.1)
Lymphs Abs: 2727 cells/uL (ref 850–3900)
MCH: 28.2 pg (ref 27.0–33.0)
MCHC: 33.3 g/dL (ref 32.0–36.0)
MCV: 84.7 fL (ref 80.0–100.0)
MPV: 10.6 fL (ref 7.5–12.5)
Monocytes Relative: 6.9 %
Neutro Abs: 3488 cells/uL (ref 1500–7800)
Neutrophils Relative %: 51.3 %
Platelets: 317 10*3/uL (ref 140–400)
RBC: 5.28 10*6/uL (ref 4.20–5.80)
RDW: 12.9 % (ref 11.0–15.0)
Total Lymphocyte: 40.1 %
WBC: 6.8 10*3/uL (ref 3.8–10.8)

## 2021-10-02 LAB — LIPID PANEL
Cholesterol: 208 mg/dL — ABNORMAL HIGH (ref <200)
HDL: 36 mg/dL — ABNORMAL LOW (ref >=40)
LDL Cholesterol (Calc): 136 mg/dL (calc) — ABNORMAL HIGH
Non-HDL Cholesterol (Calc): 172 mg/dL (calc) — ABNORMAL HIGH (ref <130)
Total CHOL/HDL Ratio: 5.8 (calc) — ABNORMAL HIGH (ref <5.0)
Triglycerides: 226 mg/dL — ABNORMAL HIGH (ref <150)

## 2021-10-02 LAB — HEPATITIS C ANTIBODY
Hepatitis C Ab: NONREACTIVE
SIGNAL TO CUT-OFF: 0.03 (ref <1.00)
